# Patient Record
Sex: Male | Born: 1965 | Race: Black or African American | Hispanic: No | Marital: Married | Smoking: Former smoker
Health system: Southern US, Community
[De-identification: ages and names within clinical notes are randomized; demographics above are authoritative.]

## PROBLEM LIST (undated history)

## (undated) DIAGNOSIS — I10 Essential (primary) hypertension: Secondary | ICD-10-CM

## (undated) DIAGNOSIS — A6000 Herpesviral infection of urogenital system, unspecified: Secondary | ICD-10-CM

## (undated) DIAGNOSIS — R7989 Other specified abnormal findings of blood chemistry: Secondary | ICD-10-CM

## (undated) DIAGNOSIS — E785 Hyperlipidemia, unspecified: Secondary | ICD-10-CM

## (undated) DIAGNOSIS — E291 Testicular hypofunction: Secondary | ICD-10-CM

## (undated) DIAGNOSIS — N529 Male erectile dysfunction, unspecified: Secondary | ICD-10-CM

## (undated) DIAGNOSIS — H409 Unspecified glaucoma: Secondary | ICD-10-CM

## (undated) HISTORY — DX: Herpesviral infection of urogenital system, unspecified: A60.00

## (undated) HISTORY — DX: Unspecified glaucoma: H40.9

## (undated) HISTORY — DX: Testicular hypofunction: E29.1

## (undated) HISTORY — DX: Other specified abnormal findings of blood chemistry: R79.89

## (undated) HISTORY — DX: Male erectile dysfunction, unspecified: N52.9

## (undated) HISTORY — PX: THROAT SURGERY: SHX803

## (undated) HISTORY — PX: WISDOM TOOTH EXTRACTION: SHX21

## (undated) HISTORY — DX: Hyperlipidemia, unspecified: E78.5

## (undated) HISTORY — DX: Essential (primary) hypertension: I10

---

## 2004-04-13 ENCOUNTER — Inpatient Hospital Stay (HOSPITAL_COMMUNITY): Admission: EM | Admit: 2004-04-13 | Discharge: 2004-04-15 | Payer: Self-pay | Admitting: Emergency Medicine

## 2004-06-16 ENCOUNTER — Emergency Department (HOSPITAL_COMMUNITY): Admission: EM | Admit: 2004-06-16 | Discharge: 2004-06-16 | Payer: Self-pay | Admitting: Emergency Medicine

## 2005-10-27 ENCOUNTER — Ambulatory Visit (HOSPITAL_BASED_OUTPATIENT_CLINIC_OR_DEPARTMENT_OTHER): Admission: RE | Admit: 2005-10-27 | Discharge: 2005-10-27 | Payer: Self-pay | Admitting: Otolaryngology

## 2010-11-02 ENCOUNTER — Encounter: Payer: Self-pay | Admitting: Internal Medicine

## 2011-03-18 ENCOUNTER — Emergency Department (HOSPITAL_COMMUNITY): Payer: BC Managed Care – PPO

## 2011-03-18 ENCOUNTER — Emergency Department (HOSPITAL_COMMUNITY)
Admission: EM | Admit: 2011-03-18 | Discharge: 2011-03-18 | Disposition: A | Discharge status: Home / Self Care | Payer: BC Managed Care – PPO | Attending: Emergency Medicine | Admitting: Emergency Medicine

## 2011-03-18 DIAGNOSIS — I10 Essential (primary) hypertension: Secondary | ICD-10-CM | POA: Insufficient documentation

## 2011-03-18 DIAGNOSIS — J029 Acute pharyngitis, unspecified: Secondary | ICD-10-CM | POA: Insufficient documentation

## 2011-03-18 LAB — CBC
HCT: 54.4 % — ABNORMAL HIGH (ref 39.0–52.0)
MCHC: 32.9 g/dL (ref 30.0–36.0)
MCV: 91.1 fL (ref 78.0–100.0)
Platelets: 197 10*3/uL (ref 150–400)
RBC: 5.97 MIL/uL — ABNORMAL HIGH (ref 4.22–5.81)
RDW: 14.3 % (ref 11.5–15.5)
WBC: 17.3 10*3/uL — ABNORMAL HIGH (ref 4.0–10.5)

## 2011-03-18 LAB — POCT I-STAT, CHEM 8
BUN: 14 mg/dL (ref 6–23)
Calcium, Ion: 1.12 mmol/L (ref 1.12–1.32)
Chloride: 106 mEq/L (ref 96–112)
Creatinine, Ser: 1.2 mg/dL (ref 0.50–1.35)
Glucose, Bld: 97 mg/dL (ref 70–99)
HCT: 62 % — ABNORMAL HIGH (ref 39.0–52.0)
Hemoglobin: 21.1 g/dL (ref 13.0–17.0)
Potassium: 3.9 mEq/L (ref 3.5–5.1)
Sodium: 141 mEq/L (ref 135–145)
TCO2: 24 mmol/L (ref 0–100)

## 2011-03-18 LAB — DIFFERENTIAL
Basophils Absolute: 0 10*3/uL (ref 0.0–0.1)
Eosinophils Absolute: 0.1 10*3/uL (ref 0.0–0.7)
Eosinophils Relative: 1 % (ref 0–5)
Lymphocytes Relative: 11 % — ABNORMAL LOW (ref 12–46)
Lymphs Abs: 1.8 10*3/uL (ref 0.7–4.0)
Monocytes Absolute: 1.4 10*3/uL — ABNORMAL HIGH (ref 0.1–1.0)
Monocytes Relative: 8 % (ref 3–12)
Neutro Abs: 14 10*3/uL — ABNORMAL HIGH (ref 1.7–7.7)
Neutrophils Relative %: 81 % — ABNORMAL HIGH (ref 43–77)

## 2011-03-18 LAB — RAPID STREP SCREEN (MED CTR MEBANE ONLY): Streptococcus, Group A Screen (Direct): NEGATIVE

## 2011-03-18 LAB — POCT I-STAT TROPONIN I: Troponin i, poc: 0 ng/mL (ref 0.00–0.08)

## 2012-02-25 ENCOUNTER — Telehealth (INDEPENDENT_AMBULATORY_CARE_PROVIDER_SITE_OTHER): Payer: Self-pay

## 2012-02-25 NOTE — Telephone Encounter (Signed)
[facility] pt has appt with Dr. Corliss Skains at 4:15 on 02/26/12.

## 2012-02-26 ENCOUNTER — Ambulatory Visit (INDEPENDENT_AMBULATORY_CARE_PROVIDER_SITE_OTHER): Payer: BC Managed Care – PPO | Admitting: Surgery

## 2012-02-26 ENCOUNTER — Encounter (INDEPENDENT_AMBULATORY_CARE_PROVIDER_SITE_OTHER): Payer: Self-pay | Admitting: Surgery

## 2012-02-26 VITALS — BP 122/64 | HR 64 | Temp 98.0°F | Resp 16 | Ht 74.0 in | Wt 176.2 lb

## 2012-02-26 DIAGNOSIS — K645 Perianal venous thrombosis: Secondary | ICD-10-CM

## 2012-02-26 NOTE — Progress Notes (Signed)
Patient ID: Shawn Pena, male   DOB: [birth year], 46 y.o.   MRN: [id number]  Chief Complaint  Patient presents with  . Pain    thrombosed hems    HPI Shawn Pena is a 46 y.o. male.  Referred by Dr. Margaretmary Pena HPI This is a 46 year old male in good health who presents with a thrombosed external hemorrhoid. This has been present for several days. The patient does admit to some constipation. He denies any bleeding. The pain has become quite severe. He presents now for evaluation. Dr. Chestine Pena did try to treat him with p.r.n. ProctoFoam p.r.n. pain medication.Past Medical History  Diagnosis Date  . Hypertension     History reviewed. No pertinent past surgical history.  Family History  Problem Relation Age of Onset  . Kidney disease Mother   . Heart disease Mother   . Hypertension Mother   . Diabetes Mother   . Hypertension Father   . Hypertension Sister   . Hypertension Brother   . Cancer Maternal Grandmother     stomach  . Cancer Maternal Grandfather     prostate  . Cancer Paternal Grandmother     ovarian  . Stroke Paternal Grandfather     Social History History  Substance Use Topics  . Smoking status: Former Smoker    Quit date: 07/14/1997  . Smokeless tobacco: Never Used  . Alcohol Use: Yes     "bottle of wine / 6 beers / 4 shots of liquor per week"    No Known Allergies  Current Outpatient Prescriptions  Medication Sig Dispense Refill  . bimatoprost (LUMIGAN) 0.03 % ophthalmic drops 1 drop at bedtime.        . hydrochlorothiazide (HYDRODIURIL) 25 MG tablet Take 25 mg by mouth daily.      Marland Kitchen testosterone cypionate (DEPOTESTOTERONE CYPIONATE) 100 MG/ML injection Inject 200 mg into the muscle every 28 (twenty-eight) days. For IM use only      . Testosterone (ANDROGEL PUMP) 1.25 GM/ACT (1%) GEL Place onto the skin.          Review of Systems Review of Systems  Constitutional: Negative for fever, chills and unexpected weight change.  HENT: Negative for hearing  loss, congestion, sore throat, trouble swallowing and voice change.   Eyes: Negative for visual disturbance.  Respiratory: Negative for cough and wheezing.   Cardiovascular: Negative for chest pain, palpitations and leg swelling.  Gastrointestinal: Positive for constipation, anal bleeding and rectal pain. Negative for nausea, vomiting, abdominal pain, diarrhea, blood in stool and abdominal distention.  Genitourinary: Negative for hematuria and difficulty urinating.  Musculoskeletal: Negative for arthralgias.  Skin: Negative for rash and wound.  Neurological: Negative for seizures, syncope, weakness and headaches.  Hematological: Negative for adenopathy. Does not bruise/bleed easily.  Psychiatric/Behavioral: Negative for confusion.    Blood pressure 122/64, pulse 64, temperature 98 F (36.7 C), temperature source Temporal, resp. rate 16, height 6\' 2"  (1.88 m), weight 176 lb 3.2 oz (79.924 kg).  Physical Exam Physical Exam WDWN in NAD Rectal - very large thrombosed external hemorrhoid left lateral; no sign of abscess or fistula; no obvious fissure  Data Reviewed none  Assessment    Large thrombosed external hemorrhoid    Plan    Evacuation of thrombosed external hemorrhoid - Betadine, 1% lidocaine, 2 cm incision, we evacuated a large amount of clot with decompression of the thrombosed hemorrhoid. Dry dressing was applied.  The patient should wear gauze in his underwear until the wound is healed. Frequent  sitz baths. Stool softeners as needed to avoid constipation. Followup if the wound has not healed to 3 weeks.       Shawn Pena K. 02/26/2012, 5:37 PM

## 2012-02-26 NOTE — Patient Instructions (Addendum)
Frequent sitz baths Stool softeners as needed to avoid constipation

## 2012-03-01 ENCOUNTER — Telehealth (INDEPENDENT_AMBULATORY_CARE_PROVIDER_SITE_OTHER): Payer: Self-pay

## 2012-03-01 NOTE — Telephone Encounter (Signed)
The patient would like to know when he can return to work.  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  I asked Dr Corliss Skains and he states he should be doing better by now and if so then he can go back tomorrow.  I notified the pt and he does not need a note.

## 2012-07-10 ENCOUNTER — Ambulatory Visit: Payer: Self-pay | Admitting: Family Medicine

## 2012-07-10 ENCOUNTER — Ambulatory Visit: Payer: Self-pay

## 2012-07-10 VITALS — BP 98/62 | HR 91 | Temp 99.7°F | Resp 16 | Ht 75.0 in | Wt 168.0 lb

## 2012-07-10 DIAGNOSIS — R05 Cough: Secondary | ICD-10-CM

## 2012-07-10 DIAGNOSIS — J189 Pneumonia, unspecified organism: Secondary | ICD-10-CM

## 2012-07-10 DIAGNOSIS — R059 Cough, unspecified: Secondary | ICD-10-CM

## 2012-07-10 LAB — POCT CBC
Granulocyte percent: 72 %G (ref 37–80)
HCT, POC: 46.3 % (ref 43.5–53.7)
Hemoglobin: 13.7 g/dL — AB (ref 14.1–18.1)
Lymph, poc: 2.6 (ref 0.6–3.4)
MCH, POC: 27.8 pg (ref 27–31.2)
MCHC: 29.6 g/dL — AB (ref 31.8–35.4)
MCV: 94.1 fL (ref 80–97)
MID (cbc): 1.2 — AB (ref 0–0.9)
MPV: 8.7 fL (ref 0–99.8)
POC Granulocyte: 9.6 — AB (ref 2–6.9)
POC LYMPH PERCENT: 19.3 %L (ref 10–50)
POC MID %: 8.7 %M (ref 0–12)
Platelet Count, POC: 143 10*3/uL (ref 142–424)
RBC: 4.92 M/uL (ref 4.69–6.13)
RDW, POC: 13.9 %
WBC: 13.3 10*3/uL — AB (ref 4.6–10.2)

## 2012-07-10 LAB — POCT INFLUENZA A/B
Influenza A, POC: NEGATIVE
Influenza B, POC: NEGATIVE

## 2012-07-10 MED ORDER — GUAIFENESIN-CODEINE 100-10 MG/5ML PO SYRP: 10.0000 mL | ORAL_SOLUTION | Freq: Four times a day (QID) | ORAL | Status: DC | PRN

## 2012-07-10 MED ORDER — LEVOFLOXACIN 750 MG PO TABS: 750.0000 mg | ORAL_TABLET | Freq: Every day | ORAL | Status: AC

## 2012-07-10 NOTE — Progress Notes (Signed)
Subjective:    Patient ID: Shawn Pena, male    DOB: 11-Feb-1966, 46 y.o.   MRN: [id number]  HPI  Shawn Pena is a 46 yo man who has been ill for a week with URI type symptoms. Severe fatigue, and increasing congestion and then today noted bright red blood in his mucous-like sputum.  No ShoB or CP, no nasal congestion but whole body hurts, chills, sweats but has not checked temp.  Past Medical History  Diagnosis Date  . Hypertension   . Glaucoma   . Hypogonadism male    Current Outpatient Prescriptions on File Prior to Visit  Medication Sig Dispense Refill  . bimatoprost (LUMIGAN) 0.03 % ophthalmic drops 1 drop at bedtime.        . hydrochlorothiazide (HYDRODIURIL) 25 MG tablet Take 25 mg by mouth daily.      . Testosterone (ANDROGEL PUMP) 1.25 GM/ACT (1%) GEL Place onto the skin.         No Known Allergies   Review of Systems  Constitutional: Positive for chills, activity change, appetite change and fatigue. Negative for fever, diaphoresis and unexpected weight change.  HENT: Positive for neck pain. Negative for ear pain, congestion, sore throat, rhinorrhea, sneezing, trouble swallowing, neck stiffness and sinus pressure.   Respiratory: Positive for cough. Negative for chest tightness and shortness of breath.   Cardiovascular: Negative for chest pain.  Gastrointestinal: Positive for nausea. Negative for vomiting, abdominal pain, diarrhea and constipation.  Genitourinary: Negative for dysuria.  Musculoskeletal: Positive for myalgias and arthralgias. Negative for joint swelling and gait problem.  Skin: Negative for rash.  Neurological: Positive for headaches. Negative for syncope.  Hematological: Negative for adenopathy. Does not bruise/bleed easily.  Psychiatric/Behavioral: Positive for sleep disturbance.      BP 98/62  Pulse 91  Temp 99.7 F (37.6 C)  Resp 16  Ht 6\' 3"  (1.905 m)  Wt 168 lb (76.204 kg)  BMI 21.00 kg/m2  SpO2 94% Objective:   Physical Exam    Constitutional: He is oriented to person, place, and time. He appears well-developed and well-nourished. He appears lethargic.  Non-toxic appearance. He appears ill. No distress.  HENT:  Head: Normocephalic and atraumatic.  Right Ear: External ear and ear canal normal.  Left Ear: Tympanic membrane, external ear and ear canal normal.  Nose: Nose normal.  Mouth/Throat: Oropharynx is clear and moist and mucous membranes are normal. No oropharyngeal exudate.       Right canal occulded w/ cerumen  Eyes: Conjunctivae normal are normal. No scleral icterus.  Neck: Normal range of motion. Neck supple. No thyromegaly present.  Cardiovascular: Normal rate, regular rhythm, normal heart sounds and intact distal pulses.   Pulmonary/Chest: Effort normal. No respiratory distress. He has decreased breath sounds. He has rhonchi.       Bronchial breath sounds diffuse  Musculoskeletal: He exhibits no edema.  Lymphadenopathy:    He has no cervical adenopathy.  Neurological: He is oriented to person, place, and time. He appears lethargic.  Skin: Skin is warm and dry. He is not diaphoretic. No erythema.  Psychiatric: He has a normal mood and affect. His behavior is normal.     [facility] reading (PRIMARY) by  Dr. Clelia Croft.  Right lower lobe infiltrate - on radiology over-read the suspected infiltrate was actually an extravacation of the hemidiaphragm though due to bibasilar atelectasis, an infiltrate could not be excluded.  Results for orders placed in visit on 07/10/12  POCT INFLUENZA A/B      Component  Value Range   Influenza A, POC Negative     Influenza B, POC Negative    POCT CBC      Component Value Range   WBC 13.3 (*) 4.6 - 10.2 K/uL   Lymph, poc 2.6  0.6 - 3.4   POC LYMPH PERCENT 19.3  10 - 50 %L   MID (cbc) 1.2 (*) 0 - 0.9   POC MID % 8.7  0 - 12 %M   POC Granulocyte 9.6 (*) 2 - 6.9   Granulocyte percent 72.0  37 - 80 %G   RBC 4.92  4.69 - 6.13 M/uL   Hemoglobin 13.7 (*) 14.1 - 18.1 g/dL   HCT, POC  16.1  09.6 - 53.7 %   MCV 94.1  80 - 97 fL   MCH, POC 27.8  27 - 31.2 pg   MCHC 29.6 (*) 31.8 - 35.4 g/dL   RDW, POC 04.5     Platelet Count, POC 143  142 - 424 K/uL   MPV 8.7  0 - 99.8 fL    Assessment & Plan:   1. Cough  POCT Influenza A/B, POCT CBC, DG Chest 2 View  2. Pneumonia  levofloxacin (LEVAQUIN) 750 MG tablet, guaiFENesin-codeine (ROBITUSSIN AC) 100-10 MG/5ML syrup  RTC if no improvement within 1-2d or if sxs worse at all.

## 2012-07-26 ENCOUNTER — Other Ambulatory Visit: Payer: Self-pay | Admitting: *Deleted

## 2012-07-26 DIAGNOSIS — J189 Pneumonia, unspecified organism: Secondary | ICD-10-CM

## 2014-01-30 ENCOUNTER — Ambulatory Visit (INDEPENDENT_AMBULATORY_CARE_PROVIDER_SITE_OTHER): Payer: BC Managed Care – PPO | Admitting: Family Medicine

## 2014-01-30 VITALS — BP 122/78 | HR 76 | Temp 98.3°F | Resp 16 | Ht 74.0 in | Wt 172.6 lb

## 2014-01-30 DIAGNOSIS — H6121 Impacted cerumen, right ear: Secondary | ICD-10-CM

## 2014-01-30 DIAGNOSIS — H9190 Unspecified hearing loss, unspecified ear: Secondary | ICD-10-CM

## 2014-01-30 DIAGNOSIS — H9191 Unspecified hearing loss, right ear: Secondary | ICD-10-CM

## 2014-01-30 DIAGNOSIS — H6120 Impacted cerumen, unspecified ear: Secondary | ICD-10-CM

## 2014-01-30 NOTE — Patient Instructions (Signed)
Cerumen Plug  A cerumen plug is having too much wax in your ear canal. The outer ear canal is lined with hairs and glands that secrete wax. This wax is called cerumen. This protects the ear canal. It also helps prevent material from entering the ear. Too much wax can cause a feeling of fullness in the ears, decreased hearing, ringing in the ears, or an earache. Sometimes your caregiver will remove a cerumen plug with an instrument called a curette. Or he/she may flush the ear canal with warm water from a syringe to remove the wax. You may simply be sent home to follow the home care instructions below for wax removal.  Generally ear wax does not have to be removed unless it is causing a problem such as one of those listed above. When too much wax is causing a problem, the following are a few home remedies which can be used to help this problem.  HOME CARE INSTRUCTIONS   · Put a couple drops of glycerin, baby oil, or mineral oil in the ear a couple times of day. Do this every day for several days. After putting the drops in, you will need to lay with the affected ear pointing up for a couple minutes. This allows the drops to remain in the canal and run down to the area of wax blockage. This will soften the wax plug. It may also make your hearing worse as the wax softens and blocks the canal even more.  · After a couple days, you may gently flush the ear canal with warm water from a syringe. Do this by pulling your ear up and back with your head tilted slightly forward and towards a pan to catch the water. This is most easily done with a helper. You can also accomplish the same thing by letting the shower beat into your ear canal to wash the wax out. Sometimes this will not be immediately successful. You will have to return to the first step of using the oil to further soften the wax. Then resume washing the ear canal out with a syringe or shower.  · Following removal of the wax, put ten to twenty drops of rubbing  alcohol into the outer ears. This will dry the canal and prevent an infection.  · Do not irrigate or wash out your ears if you have had a perforated ear drum or mastoid surgery.  SEEK IMMEDIATE MEDICAL CARE IF:   · You are unsuccessful with the above instructions for home care.  · You develop ear pain or drainage from the ear.  MAKE SURE YOU:   · Understand these instructions.  · Will watch your condition.  · Will get help right away if you are not doing well or get worse.  Document Released: 03/25/2001 Document Revised: 09/22/2011 Document Reviewed: 06/21/2008  ExitCare® Patient Information ©2015 [employer]. This information is not intended to replace advice given to you by your health care provider. Make sure you discuss any questions you have with your health care provider.

## 2014-01-30 NOTE — Progress Notes (Signed)
   Subjective:    Patient ID: Shawn Pena, male    DOB: 05/24/1966, 48 y.o.   MRN: [id number]  HPI Patient presents today with c/o stopped up ears for about a week. Head feels full and he has ringing in the right ear. Now, feels muffled, right greater than left. He was diagnosed with  pharyngitis 3 weeks ago, this resolved with antibiotic/mucinex. He was recently told that his HCTZ dose may be "overly drying," and has been cutting his tablet in half per his doctor's recommendation.   Past Medical History  Diagnosis Date  . Hypertension   . Glaucoma   . Hypogonadism male   History reviewed.  No pertinent past surgical history.  Review of Systems No runny nose, no sore throat, no cough, no fever, no SOB/CP.    Objective:   Physical Exam  Vitals reviewed. Constitutional: He is oriented to person, place, and time. He appears well-developed and well-nourished.  HENT:  Head: Normocephalic and atraumatic.  Right Ear: There is tenderness. Decreased hearing is noted.  Left Ear: Hearing, tympanic membrane and external ear normal.  Nose: Nose normal.  Mouth/Throat: Oropharynx is clear and moist.  Right inferior, distal ear canal with healing abrasion (patient reports he scratched it), canal occluded with cerumen.  Eyes: Right eye exhibits no discharge. Left eye exhibits no discharge. Right conjunctiva is not injected. Left conjunctiva is not injected.  Neck: Normal range of motion. Neck supple.  Cardiovascular: Normal rate.   Musculoskeletal: Normal range of motion.  Lymphadenopathy:    He has no cervical adenopathy.  Neurological: He is alert and oriented to person, place, and time.  Skin: Skin is warm and dry.  Psychiatric: He has a normal mood and affect. His behavior is normal. Judgment and thought content normal.          Assessment & Plan:

## 2015-09-28 ENCOUNTER — Ambulatory Visit (INDEPENDENT_AMBULATORY_CARE_PROVIDER_SITE_OTHER): Payer: PRIVATE HEALTH INSURANCE | Admitting: Internal Medicine

## 2015-09-28 VITALS — BP 132/80 | HR 104 | Temp 98.6°F | Resp 17 | Ht 75.5 in | Wt 181.0 lb

## 2015-09-28 DIAGNOSIS — B349 Viral infection, unspecified: Secondary | ICD-10-CM | POA: Diagnosis not present

## 2015-09-28 NOTE — Patient Instructions (Signed)
     IF you received an x-ray today, you will receive an invoice from [facility]. Please contact [facility] at [phone] with questions or concerns regarding your invoice.   IF you received labwork today, you will receive an invoice from [facility]. Please contact [facility] at [phone] with questions or concerns regarding your invoice.   Our billing staff will not be able to assist you with questions regarding bills from these companies.  You will be contacted with the lab results as soon as they are available. The fastest way to get your results is to activate your My Chart account. Instructions are located on the last page of this paperwork. If you have not heard from us regarding the results in 2 weeks, please contact this office.      

## 2015-09-28 NOTE — Progress Notes (Signed)
   Subjective:  By signing my name below, I, Shawn Pena, attest that this documentation has been prepared under the direction and in the presence of Tami Lin, MD.  Electronically Signed: [name], ED Scribe. 09/28/2015. 2:43 PM.  Patient ID: Shawn Pena, male    DOB: Aug 03, 1965, 50 y.o.   MRN: [id number]  HPI Chief Complaint  Patient presents with  . Cough  . Fatigue  . Nasal Congestion  . Sore Throat    HPI Comments: Shawn Pena is a 50 y.o. male who presents to the [facility] complaining of cough and fatigue that began about 1 week ago. Pt states for the past week he has felt worn out with body aches. He developed cough, nasal congestion and mild throat irritation that began last night. He took cough medication last night. He reports some trouble sleeping due to symptoms. He denies fever, trouble swallowing, decreased appetite, urinary symptoms. .   Patient Active Problem List   Diagnosis Date Noted  . Thrombosed external hemorrhoid 02/26/2012   History reviewed. No pertinent past surgical history. Past Medical History  Diagnosis Date  . Hypertension   . Glaucoma   . Hypogonadism male    Prior to Admission medications   Medication Sig Start Date End Date Taking? Authorizing Provider  bimatoprost (LUMIGAN) 0.03 % ophthalmic drops 1 drop at bedtime.     Yes Historical Provider, MD  brimonidine-timolol (COMBIGAN) 0.2-0.5 % ophthalmic solution Place 2 drops into both eyes every 12 (twelve) hours.   Yes Historical Provider, MD  guaiFENesin (MUCINEX) 600 MG 12 hr tablet Take by mouth 2 (two) times daily.   Yes Historical Provider, MD  hydrochlorothiazide (HYDRODIURIL) 25 MG tablet Take 25 mg by mouth daily.   Yes Historical Provider, MD    Review of Systems  Constitutional: Negative for fever, chills and appetite change.  HENT: Positive for congestion and sore throat. Negative for trouble swallowing.   Respiratory: Positive for cough. Negative  for wheezing.   Gastrointestinal: Negative for nausea, vomiting and abdominal distention.  Genitourinary: Negative for dysuria, frequency and difficulty urinating.   Objective:   Physical Exam  Constitutional: He is oriented to person, place, and time. He appears well-developed and well-nourished. No distress.  HENT:  Head: Normocephalic and atraumatic.  Right Ear: External ear normal.  Left Ear: External ear normal.  Nose: Nose normal.  Mouth/Throat: Oropharynx is clear and moist. No oropharyngeal exudate.  Eyes: Conjunctivae and EOM are normal.  Neck: Neck supple.  Cardiovascular: Normal rate, regular rhythm and normal heart sounds.   No murmur heard. Pulmonary/Chest: Effort normal and breath sounds normal. He has no wheezes. He has no rales.  Musculoskeletal: Normal range of motion.  Neurological: He is alert and oriented to person, place, and time.  Skin: Skin is warm and dry.  Psychiatric: He has a normal mood and affect. His behavior is normal.  Nursing note and vitals reviewed.  Filed Vitals:   09/28/15 1348  BP: 132/80  Pulse: 104  Temp: 98.6 F (37 C)  TempSrc: Oral  Resp: 17  Height: 6' 3.5" (1.918 m)  Weight: 181 lb (82.101 kg)  SpO2: 98%    Assessment & Plan:  Viral syndrome  otc meds bedrest/fluids oow til tues

## 2017-07-20 ENCOUNTER — Telehealth: Payer: Self-pay | Admitting: Internal Medicine

## 2017-07-20 NOTE — Telephone Encounter (Signed)
Records received put in your folder pt is coming in for new pt 08/17/2017

## 2017-08-17 ENCOUNTER — Encounter: Payer: Self-pay | Admitting: Medical

## 2017-08-17 ENCOUNTER — Encounter: Payer: Self-pay | Admitting: Internal Medicine

## 2017-08-17 ENCOUNTER — Ambulatory Visit: Payer: PRIVATE HEALTH INSURANCE | Admitting: Medical

## 2017-08-17 VITALS — BP 118/84 | HR 97 | Ht 74.0 in | Wt 179.4 lb

## 2017-08-17 DIAGNOSIS — Z1211 Encounter for screening for malignant neoplasm of colon: Secondary | ICD-10-CM | POA: Diagnosis not present

## 2017-08-17 DIAGNOSIS — Z1159 Encounter for screening for other viral diseases: Secondary | ICD-10-CM

## 2017-08-17 DIAGNOSIS — E291 Testicular hypofunction: Secondary | ICD-10-CM | POA: Diagnosis not present

## 2017-08-17 DIAGNOSIS — R7989 Other specified abnormal findings of blood chemistry: Secondary | ICD-10-CM | POA: Insufficient documentation

## 2017-08-17 DIAGNOSIS — Z125 Encounter for screening for malignant neoplasm of prostate: Secondary | ICD-10-CM | POA: Insufficient documentation

## 2017-08-17 DIAGNOSIS — Z0000 Encounter for general adult medical examination without abnormal findings: Secondary | ICD-10-CM | POA: Insufficient documentation

## 2017-08-17 NOTE — Progress Notes (Signed)
Subjective: Chief Complaint  Patient presents with  . New Patient (Initial Visit)    discuss testosterone    Was seeing Dr. Jeanann Lewandowsky for last 30 years until he retired recently.  Thus he needed to get new PCP.    Has hx/o low testosterone, hypogonadism since age 52yo.   In the past has used Androgel, other gel, but been on TST injection 2 times per month. Used to get the shot in the past, but wife has been giving the shot at home for past month.  Last lab for TST last year twice.  Been doing TST injection 2 times per month for past 6+ months.    Is not completely out, has refills.    Last injection was last week on Wednesday about 5 days ago.   His vial label shows 200mg /ml, 1.5cc q2wk.    HTN - on HCTZ daily.  [facility] and [facility] tract infection, was put on prednisone, doxycycline and tessalon perles.   Doing better with those symptoms.  He notes hx/o facial swelling, worse years ago.  Had allergist consult, had allergy testing.  They were never sure what caused it but thinks it was stress related.  Was under financial pressure 2005-2013.  Once he had it paid off hasn't had those issues any more.    No other aggravating or relieving factors. No other complaint.   Past Medical History:  Diagnosis Date  . Glaucoma   . Hypertension   . Hypogonadism male    Current Outpatient Medications on File Prior to Visit  Medication Sig Dispense Refill  . benzonatate (TESSALON) 100 MG capsule Swallow whole one (100mg ) capsule by mouth 3 times a day  as needed.Do not break, chew, dissolve, cut or crush.    . benzonatate (TESSALON) 100 MG capsule TAKE 1 CAPSULE BY MOUTH THREE TIMES A DAY AS NEEDED *DO NOT BREAK, CHEW, DISSOLVE, CUT, OR CRUSH*  0  . bimatoprost (LUMIGAN) 0.03 % ophthalmic drops 1 drop at bedtime.      . brimonidine-timolol (COMBIGAN) 0.2-0.5 % ophthalmic solution Place 2 drops into both eyes every 12 (twelve) hours.    Marland Kitchen doxycycline  (VIBRA-TABS) 100 MG tablet Take by mouth.    . hydrochlorothiazide (HYDRODIURIL) 25 MG tablet Take 25 mg by mouth daily.    . predniSONE (DELTASONE) 10 MG tablet 40 mg by mouth daily for 5 days    . guaiFENesin (MUCINEX) 600 MG 12 hr tablet Take by mouth 2 (two) times daily.     No current facility-administered medications on file prior to visit.    ROS as in subjective    Objective: BP 118/84   Pulse 97   Ht 6\' 2"  (1.88 m)   Wt 179 lb 6.4 oz (81.4 kg)   SpO2 96%   BMI 23.03 kg/m   General appearance: alert, no distress, WD/WN,  Neck: supple, no lymphadenopathy, no thyromegaly, no masses Heart: RRR, normal S1, S2, no murmurs Lungs: CTA bilaterally, no wheezes, rhonchi, or rales Abdomen: +bs, soft, non tender, non distended, no masses, no hepatomegaly, no splenomegaly Pulses: 2+ symmetric, upper and lower extremities, normal cap refill Ext: no edema    Assessment: Encounter Diagnoses  Name Primary?  . Low testosterone Yes  . Hypogonadism in male   . Screening for prostate cancer   . Screen for colon cancer      Plan Low testosterone, hypogonadism - has been on current therapy for a while.  Lab today.   C/t same medication  PSA lab today.  Plan rectal exam on next visit/physical visit  Refer for 1st colonoscopy  [place] was seen today for new patient (initial visit).  Diagnoses and all orders for this visit:  Low testosterone -     Testosterone  Hypogonadism in male -     Testosterone  Screening for prostate cancer -     PSA  Screen for colon cancer -     Ambulatory referral to Gastroenterology

## 2017-08-18 LAB — PSA: Prostate Specific Ag, Serum: 1.7 ng/mL (ref 0.0–4.0)

## 2017-08-18 LAB — TESTOSTERONE: Testosterone: 588 ng/dL (ref 264–916)

## 2017-08-24 ENCOUNTER — Other Ambulatory Visit (INDEPENDENT_AMBULATORY_CARE_PROVIDER_SITE_OTHER): Payer: PRIVATE HEALTH INSURANCE

## 2017-08-24 DIAGNOSIS — E291 Testicular hypofunction: Secondary | ICD-10-CM

## 2017-08-24 MED ORDER — TESTOSTERONE CYPIONATE 200 MG/ML IM SOLN: 300.0000 mg | INTRAMUSCULAR | Status: DC

## 2017-08-24 MED ORDER — TESTOSTERONE CYPIONATE 200 MG/ML IM SOLN
300.0000 mg | INTRAMUSCULAR | Status: DC
  Administered 2017-08-24: 300 mg via INTRAMUSCULAR

## 2017-09-02 ENCOUNTER — Encounter: Payer: Self-pay | Admitting: Medical

## 2017-09-07 ENCOUNTER — Other Ambulatory Visit (INDEPENDENT_AMBULATORY_CARE_PROVIDER_SITE_OTHER): Payer: PRIVATE HEALTH INSURANCE

## 2017-09-07 DIAGNOSIS — E291 Testicular hypofunction: Secondary | ICD-10-CM | POA: Diagnosis not present

## 2017-09-07 MED ORDER — TESTOSTERONE CYPIONATE 200 MG/ML IM SOLN
300.0000 mg | INTRAMUSCULAR | Status: DC
  Administered 2017-09-07 – 2017-10-05 (×3): 300 mg via INTRAMUSCULAR

## 2017-09-21 ENCOUNTER — Other Ambulatory Visit (INDEPENDENT_AMBULATORY_CARE_PROVIDER_SITE_OTHER): Payer: PRIVATE HEALTH INSURANCE

## 2017-09-21 DIAGNOSIS — E291 Testicular hypofunction: Secondary | ICD-10-CM | POA: Diagnosis not present

## 2017-10-05 ENCOUNTER — Other Ambulatory Visit (INDEPENDENT_AMBULATORY_CARE_PROVIDER_SITE_OTHER): Payer: PRIVATE HEALTH INSURANCE

## 2017-10-05 ENCOUNTER — Other Ambulatory Visit: Payer: Self-pay

## 2017-10-05 ENCOUNTER — Ambulatory Visit (AMBULATORY_SURGERY_CENTER): Payer: PRIVATE HEALTH INSURANCE | Admitting: *Deleted

## 2017-10-05 VITALS — Ht 75.0 in | Wt 180.0 lb

## 2017-10-05 DIAGNOSIS — Z1211 Encounter for screening for malignant neoplasm of colon: Secondary | ICD-10-CM

## 2017-10-05 DIAGNOSIS — E291 Testicular hypofunction: Secondary | ICD-10-CM | POA: Diagnosis not present

## 2017-10-05 MED ORDER — NA SULFATE-K SULFATE-MG SULF 17.5-3.13-1.6 GM/177ML PO SOLN: 1.0000 | Freq: Once | ORAL | 0 refills | Status: AC

## 2017-10-05 NOTE — Progress Notes (Signed)
No egg or soy allergy known to patient  No issues with past sedation with any surgeries  or procedures, no intubation problems  No diet pills per patient No home 02 use per patient  No blood thinners per patient  Pt denies issues with constipation  No A fib or A flutter  EMMI video sent to pt's e mail  15 coupon to pt for suprep in [place] today

## 2017-10-12 HISTORY — PX: COLONOSCOPY: SHX174

## 2017-10-13 ENCOUNTER — Encounter: Payer: Self-pay | Admitting: Internal Medicine

## 2017-10-19 ENCOUNTER — Encounter: Payer: Self-pay | Admitting: Internal Medicine

## 2017-10-21 ENCOUNTER — Telehealth: Payer: Self-pay

## 2017-10-21 ENCOUNTER — Telehealth: Payer: Self-pay | Admitting: Medical

## 2017-10-21 NOTE — Telephone Encounter (Signed)
Patient wife called and stated that [facility]- and they stated he had [facility], and if he needs to have any test done that it be done at [facility] because she works there and her insurance pays for it. FYI

## 2017-10-21 NOTE — Telephone Encounter (Signed)
Please call patient and see how he is feeling.   I received notice from [facility] he saw them 10/19/17 (5:20pm) for pneumonia.     Remind him in the future to try calling here first.   We try working people in for sick visits instead of them having to go to urgent care or [facility] particularly during normal hours M-F

## 2017-10-26 ENCOUNTER — Other Ambulatory Visit (INDEPENDENT_AMBULATORY_CARE_PROVIDER_SITE_OTHER): Payer: PRIVATE HEALTH INSURANCE

## 2017-10-26 ENCOUNTER — Ambulatory Visit (AMBULATORY_SURGERY_CENTER): Payer: PRIVATE HEALTH INSURANCE | Admitting: Internal Medicine

## 2017-10-26 ENCOUNTER — Other Ambulatory Visit: Payer: Self-pay

## 2017-10-26 ENCOUNTER — Encounter: Payer: Self-pay | Admitting: Medical

## 2017-10-26 ENCOUNTER — Encounter: Payer: Self-pay | Admitting: Internal Medicine

## 2017-10-26 VITALS — BP 130/86 | HR 74 | Temp 98.4°F | Resp 22 | Ht 74.0 in | Wt 179.0 lb

## 2017-10-26 DIAGNOSIS — Z1211 Encounter for screening for malignant neoplasm of colon: Secondary | ICD-10-CM | POA: Diagnosis not present

## 2017-10-26 DIAGNOSIS — D123 Benign neoplasm of transverse colon: Secondary | ICD-10-CM

## 2017-10-26 DIAGNOSIS — E291 Testicular hypofunction: Secondary | ICD-10-CM | POA: Diagnosis not present

## 2017-10-26 MED ORDER — TESTOSTERONE CYPIONATE 200 MG/ML IM SOLN
300.0000 mg | Freq: Once | INTRAMUSCULAR | Status: AC
  Administered 2017-10-26: 300 mg via INTRAMUSCULAR

## 2017-10-26 MED ORDER — SODIUM CHLORIDE 0.9 % IV SOLN: 500.0000 mL | Freq: Once | INTRAVENOUS | Status: DC

## 2017-10-26 NOTE — Op Note (Signed)
[place] Patient Name: Shawn Pena Procedure Date: 10/26/2017 2:11 PM MRN: [id number] Endoscopist: Jerene Bears , MD Age: 52 Referring MD:  Date of Birth: [birth year] Gender: Male Account #: [id number] Procedure:                Colonoscopy Indications:              Screening for colorectal malignant neoplasm, This                            is the patient's first colonoscopy Medicines:                Monitored Anesthesia Care Procedure:                Pre-Anesthesia Assessment:                           - Prior to the procedure, a History and Physical                            was performed, and patient medications and                            allergies were reviewed. The patient's tolerance of                            previous anesthesia was also reviewed. The risks                            and benefits of the procedure and the sedation                            options and risks were discussed with the patient.                            All questions were answered, and informed consent                            was obtained. Prior Anticoagulants: The patient has                            taken no previous anticoagulant or antiplatelet                            agents. ASA Grade Assessment: II - A patient with                            mild systemic disease. After reviewing the risks                            and benefits, the patient was deemed in                            satisfactory condition to undergo the procedure.  After obtaining informed consent, the colonoscope                            was passed under direct vision. Throughout the                            procedure, the patient's blood pressure, pulse, and                            oxygen saturations were monitored continuously. The                            Colonoscope was introduced through the anus and                            advanced to the the cecum,  identified by                            appendiceal orifice and ileocecal valve. The                            colonoscopy was performed without difficulty. The                            patient tolerated the procedure well. The quality                            of the bowel preparation was good. Scope In: 2:34:24 PM Scope Out: 2:48:41 PM Scope Withdrawal Time: 0 hours 9 minutes 13 seconds  Total Procedure Duration: 0 hours 14 minutes 17 seconds  Findings:                 The digital rectal exam was normal.                           Two sessile polyps were found in the transverse                            colon. The polyps were 4 to 5 mm in size. These                            polyps were removed with a cold snare. Resection                            and retrieval were complete.                           A few small-mouthed diverticula were found in the                            hepatic flexure.                           The exam was otherwise without abnormality on  direct and retroflexion views. Complications:            No immediate complications. Estimated Blood Loss:     Estimated blood loss was minimal. Impression:               - Two 4 to 5 mm polyps in the transverse colon,                            removed with a cold snare. Resected and retrieved.                           - Diverticulosis at the hepatic flexure.                           - The examination was otherwise normal on direct                            and retroflexion views. Recommendation:           - Patient has a contact number available for                            emergencies. The signs and symptoms of potential                            delayed complications were discussed with the                            patient. Return to normal activities tomorrow.                            Written discharge instructions were provided to the                            patient.                            - Resume previous diet.                           - Continue present medications.                           - Await pathology results.                           - Repeat colonoscopy is recommended. The                            colonoscopy date will be determined after pathology                            results from today's exam become available for                            review. Jerene Bears, MD 10/26/2017 2:52:11 PM This report has been signed  electronically.

## 2017-10-26 NOTE — Progress Notes (Signed)
Report given to PACU, vss 

## 2017-10-26 NOTE — Progress Notes (Signed)
Called to room to assist during endoscopic procedure.  Patient ID and intended procedure confirmed with present staff. Received instructions for my participation in the procedure from the performing physician.  

## 2017-10-26 NOTE — Patient Instructions (Signed)
YOU HAD AN ENDOSCOPIC PROCEDURE TODAY AT THE [facility]:   Refer to the procedure report that was given to you for any specific questions about what was found during the examination.  If the procedure report does not answer your questions, please call your gastroenterologist to clarify.  If you requested that your care partner not be given the details of your procedure findings, then the procedure report has been included in a sealed envelope for you to review at your convenience later.  YOU SHOULD EXPECT: Some feelings of bloating in the abdomen. Passage of more gas than usual.  Walking can help get rid of the air that was put into your GI tract during the procedure and reduce the bloating. If you had a lower endoscopy (such as a colonoscopy or flexible sigmoidoscopy) you may notice spotting of blood in your stool or on the toilet paper. If you underwent a bowel prep for your procedure, you may not have a normal bowel movement for a few days.  Please Note:  You might notice some irritation and congestion in your nose or some drainage.  This is from the oxygen used during your procedure.  There is no need for concern and it should clear up in a day or so.  SYMPTOMS TO REPORT IMMEDIATELY:   Following lower endoscopy (colonoscopy or flexible sigmoidoscopy):  Excessive amounts of blood in the stool  Significant tenderness or worsening of abdominal pains  Swelling of the abdomen that is new, acute  Fever of 100F or higher    For urgent or emergent issues, a gastroenterologist can be reached at any hour by calling [phone].   DIET:  We do recommend a small meal at first, but then you may proceed to your regular diet.  Drink plenty of fluids but you should avoid alcoholic beverages for 24 hours.  ACTIVITY:  You should plan to take it easy for the rest of today and you should NOT DRIVE or use heavy machinery until tomorrow (because of the sedation medicines used during the test).     FOLLOW UP: Our staff will call the number listed on your records the next business day following your procedure to check on you and address any questions or concerns that you may have regarding the information given to you following your procedure. If we do not reach you, we will leave a message.  However, if you are feeling well and you are not experiencing any problems, there is no need to return our call.  We will assume that you have returned to your regular daily activities without incident.  If any biopsies were taken you will be contacted by phone or by letter within the next 1-3 weeks.  Please call us at [phone] if you have not heard about the biopsies in 3 weeks.    SIGNATURES/CONFIDENTIALITY: You and/or your care partner have signed paperwork which will be entered into your electronic medical record.  These signatures attest to the fact that that the information above on your After Visit Summary has been reviewed and is understood.  Full responsibility of the confidentiality of this discharge information lies with you and/or your care-partner.    Handouts were given to your care partner on polyps and diverticulosis. You may resume your current medications today. Await biopsy results. Please call if any questions or concerns.   

## 2017-10-26 NOTE — Progress Notes (Signed)
Pt. Reports no change in his medical or surgical history since his pre-visit 10/05/2017.

## 2017-10-26 NOTE — Progress Notes (Signed)
No problems noted in the recovery room. maw 

## 2017-10-27 ENCOUNTER — Telehealth: Payer: Self-pay | Admitting: *Deleted

## 2017-10-27 NOTE — Telephone Encounter (Signed)
  Follow up Call-  Call back number 10/26/2017  Post procedure Call Back phone  # [phone]  Permission to leave phone message Yes  Some recent data might be hidden     Patient questions:  Do you have a fever, pain , or abdominal swelling? No. Pain Score  0 *  Have you tolerated food without any problems? Yes.    Have you been able to return to your normal activities? Yes.    Do you have any questions about your discharge instructions: Diet   No. Medications  No. Follow up visit  No.  Do you have questions or concerns about your Care? No.  Actions: * If pain score is 4 or above: No action needed, pain <4.

## 2017-11-02 ENCOUNTER — Encounter: Payer: Self-pay | Admitting: Internal Medicine

## 2017-11-02 ENCOUNTER — Encounter: Payer: Self-pay | Admitting: Family Medicine

## 2017-11-02 ENCOUNTER — Ambulatory Visit: Payer: PRIVATE HEALTH INSURANCE | Admitting: Family Medicine

## 2017-11-02 VITALS — BP 124/78 | HR 96 | Temp 98.1°F | Ht 75.0 in | Wt 176.0 lb

## 2017-11-02 DIAGNOSIS — J40 Bronchitis, not specified as acute or chronic: Secondary | ICD-10-CM | POA: Diagnosis not present

## 2017-11-02 MED ORDER — LEVOFLOXACIN 750 MG PO TABS: 750.0000 mg | ORAL_TABLET | Freq: Every day | ORAL | 0 refills | Status: DC

## 2017-11-02 NOTE — Progress Notes (Signed)
   Subjective:    Patient ID: Shawn Pena, male    DOB: [birth year], 52 y.o.   MRN: [id number]  HPI He is here for an interval evaluation.  He was seen on April 8 and placed on Levaquin for 5 days.  He states that he is roughly 70% better but still having difficulty with chest congestion and coughing but no fever, chills, sore throat or earache.   Review of Systems     Objective:   Physical Exam Alert and in no distress. Tympanic membranes and canals are normal. Pharyngeal area is normal. Neck is supple without adenopathy or thyromegaly. Cardiac exam shows a regular sinus rhythm without murmurs or gallops. Lungs are clear to auscultation.        Assessment & Plan:  Bronchitis - Plan: levofloxacin (LEVAQUIN) 750 MG tablet  I explained that he could potentially have had pneumonia but the therapy would be the same.  I will give him another 7 days worth of the medication and is still having difficulty, consider getting an x-ray and blood work.  He was comfortable with that.

## 2017-11-09 ENCOUNTER — Other Ambulatory Visit (INDEPENDENT_AMBULATORY_CARE_PROVIDER_SITE_OTHER): Payer: PRIVATE HEALTH INSURANCE

## 2017-11-09 DIAGNOSIS — E291 Testicular hypofunction: Secondary | ICD-10-CM | POA: Diagnosis not present

## 2017-11-09 MED ORDER — TESTOSTERONE CYPIONATE 200 MG/ML IM SOLN
300.0000 mg | Freq: Once | INTRAMUSCULAR | Status: AC
  Administered 2017-11-09: 300 mg via INTRAMUSCULAR

## 2017-11-12 ENCOUNTER — Encounter: Payer: Self-pay | Admitting: Medical

## 2017-11-16 ENCOUNTER — Encounter: Payer: Self-pay | Admitting: Medical

## 2017-11-23 ENCOUNTER — Other Ambulatory Visit (INDEPENDENT_AMBULATORY_CARE_PROVIDER_SITE_OTHER): Payer: PRIVATE HEALTH INSURANCE

## 2017-11-23 DIAGNOSIS — E291 Testicular hypofunction: Secondary | ICD-10-CM

## 2017-11-23 MED ORDER — TESTOSTERONE CYPIONATE 200 MG/ML IM SOLN
300.0000 mg | Freq: Once | INTRAMUSCULAR | Status: AC
  Administered 2017-11-23: 300 mg via INTRAMUSCULAR

## 2017-11-25 NOTE — Telephone Encounter (Signed)
done

## 2017-12-14 ENCOUNTER — Encounter: Payer: Self-pay | Admitting: Medical

## 2017-12-14 ENCOUNTER — Ambulatory Visit: Payer: PRIVATE HEALTH INSURANCE | Admitting: Medical

## 2017-12-14 VITALS — BP 120/72 | HR 70 | Resp 16 | Ht 75.5 in | Wt 176.8 lb

## 2017-12-14 DIAGNOSIS — D369 Benign neoplasm, unspecified site: Secondary | ICD-10-CM | POA: Diagnosis not present

## 2017-12-14 DIAGNOSIS — Z23 Encounter for immunization: Secondary | ICD-10-CM

## 2017-12-14 DIAGNOSIS — K635 Polyp of colon: Secondary | ICD-10-CM | POA: Diagnosis not present

## 2017-12-14 DIAGNOSIS — E291 Testicular hypofunction: Secondary | ICD-10-CM | POA: Diagnosis not present

## 2017-12-14 DIAGNOSIS — N289 Disorder of kidney and ureter, unspecified: Secondary | ICD-10-CM

## 2017-12-14 DIAGNOSIS — Z0000 Encounter for general adult medical examination without abnormal findings: Secondary | ICD-10-CM

## 2017-12-14 DIAGNOSIS — Z1159 Encounter for screening for other viral diseases: Secondary | ICD-10-CM | POA: Diagnosis not present

## 2017-12-14 DIAGNOSIS — R7989 Other specified abnormal findings of blood chemistry: Secondary | ICD-10-CM

## 2017-12-14 LAB — POCT URINALYSIS DIP (PROADVANTAGE DEVICE)
BILIRUBIN UA: NEGATIVE
BILIRUBIN UA: NEGATIVE mg/dL
Blood, UA: NEGATIVE
Glucose, UA: NEGATIVE mg/dL
Leukocytes, UA: NEGATIVE
Nitrite, UA: NEGATIVE
PH UA: 6 (ref 5.0–8.0)
PROTEIN UA: NEGATIVE mg/dL
SPECIFIC GRAVITY, URINE: 1.025
Urobilinogen, Ur: NEGATIVE

## 2017-12-14 MED ORDER — TESTOSTERONE ENANTHATE 200 MG/ML IM SOLN
300.0000 mg | Freq: Once | INTRAMUSCULAR | Status: AC
  Administered 2017-12-14: 300 mg via INTRAMUSCULAR

## 2017-12-14 NOTE — Progress Notes (Signed)
Subjective:   HPI  Shawn Pena is a 52 y.o. male who presents for Chief Complaint  Patient presents with  . Annual Exam    fasting CPE  Eye exam 1 month ago,  needs shot testosterone     Medical care team includes: Tysinger, Camelia Eng, PA-C here for primary care Dentist Eye doctor  Concerns: Had recent jock itch rash that has improved on monistat cream OTC   Reviewed their medical, surgical, family, social, medication, and allergy history and updated chart as appropriate.  Past Medical History:  Diagnosis Date  . Glaucoma   . Hypertension   . Hypogonadism male   . Low testosterone     Past Surgical History:  Procedure Laterality Date  . COLONOSCOPY  10/2017   Tubular Adenoma, repeat 5 years, [name]  . THROAT SURGERY     vocal cord polyp  . WISDOM TOOTH EXTRACTION      Social History   Socioeconomic History  . Marital status: Married    Spouse name: Not on file  . Number of children: Not on file  . Years of education: Not on file  . Highest education level: Not on file  Occupational History  . Not on file  Social Needs  . Financial resource strain: Not on file  . Food insecurity:    Worry: Not on file    Inability: Not on file  . Transportation needs:    Medical: Not on file    Non-medical: Not on file  Tobacco Use  . Smoking status: Former Smoker    Last attempt to quit: 07/14/1997    Years since quitting: 20.4  . Smokeless tobacco: Never Used  Substance and Sexual Activity  . Alcohol use: Yes    Comment: "very littl"  . Drug use: No  . Sexual activity: Never  Lifestyle  . Physical activity:    Days per week: Not on file    Minutes per session: Not on file  . Stress: Not on file  Relationships  . Social connections:    Talks on phone: Not on file    Gets together: Not on file    Attends religious service: Not on file    Active member of club or organization: Not on file    Attends meetings of clubs or organizations: Not on file   Relationship status: Not on file  . Intimate partner violence:    Fear of current or ex partner: Not on file    Emotionally abused: Not on file    Physically abused: Not on file    Forced sexual activity: Not on file  Other Topics Concern  . Not on file  Social History Narrative   Lives with wife, great nephew sometimes stays with them.  Exercise - walks some.   Stephanie Coup.  12/2017    Family History  Problem Relation Age of Onset  . Kidney disease Mother   . Heart disease Mother   . Hypertension Mother   . Diabetes Mother   . Hypertension Father   . Hypertension Sister   . Diabetes Sister   . Hypertension Brother   . Cancer Maternal Grandmother        stomach  . Stomach cancer Maternal Grandmother   . Cancer Maternal Grandfather        prostate  . Prostate cancer Maternal Grandfather   . Cancer Paternal Grandmother        ovarian  . Ovarian cancer Paternal Grandmother   .  Stroke Paternal Grandfather   . Esophageal cancer Maternal Uncle   . Colon cancer Neg Hx   . Colon polyps Neg Hx   . Rectal cancer Neg Hx      Current Outpatient Medications:  .  bimatoprost (LUMIGAN) 0.03 % ophthalmic drops, 1 drop at bedtime.  , Disp: , Rfl:  .  brimonidine-timolol (COMBIGAN) 0.2-0.5 % ophthalmic solution, Place 2 drops into both eyes every 12 (twelve) hours., Disp: , Rfl:  .  irbesartan-hydrochlorothiazide (AVALIDE) 150-12.5 MG tablet, , Disp: , Rfl:  .  testosterone enanthate (DELATESTRYL) 200 MG/ML injection, , Disp: , Rfl: 5 .  TRAVATAN Z 0.004 % SOLN ophthalmic solution, , Disp: , Rfl:  .  guaiFENesin (MUCINEX) 600 MG 12 hr tablet, Take by mouth 2 (two) times daily., Disp: , Rfl:   Current Facility-Administered Medications:  .  0.9 %  sodium chloride infusion, 500 mL, Intravenous, Once, Pyrtle, Lajuan Lines, MD .  testosterone cypionate (DEPOTESTOSTERONE CYPIONATE) injection 300 mg, 300 mg, Intramuscular, Q14 Days, Tysinger, Camelia Eng, PA-C, 300 mg at 08/24/17 1627 .  testosterone  cypionate (DEPOTESTOSTERONE CYPIONATE) injection 300 mg, 300 mg, Intramuscular, Q14 Days, Tysinger, Camelia Eng, PA-C, 300 mg at 10/05/17 1228  Allergies  Allergen Reactions  . Penicillins Rash    Review of Systems Constitutional: -fever, -chills, -sweats, -unexpected weight change, -decreased appetite, -fatigue Allergy: -sneezing, -itching, -congestion Dermatology: -changing moles, +rash, -lumps ENT: -runny nose, -ear pain, -sore throat, -hoarseness, -sinus pain, -teeth pain, - ringing in ears, -hearing loss, -nosebleeds Cardiology: -chest pain, -palpitations, -swelling, -difficulty breathing when lying flat, -waking up short of breath Respiratory: -cough, -shortness of breath, -difficulty breathing with exercise or exertion, -wheezing, -coughing up blood Gastroenterology: -abdominal pain, -nausea, -vomiting, -diarrhea, -constipation, -blood in stool, -changes in bowel movement, -difficulty swallowing or eating Hematology: -bleeding, -bruising  Musculoskeletal: -joint aches, -muscle aches, -joint swelling, -back pain, -neck pain, -cramping, -changes in gait Ophthalmology: denies vision changes, eye redness, itching, discharge Urology: -burning with urination, -difficulty urinating, -blood in urine, -urinary frequency, -urgency, -incontinence Neurology: -headache, -weakness, -tingling, -numbness, -memory loss, -falls, -dizziness Psychology: -depressed mood, -agitation, -sleep problems Male GU: no testicular mass, pain, no lymph nodes swollen, no swelling, no rash.     Objective:  BP 120/72   Pulse 70   Resp 16   Ht 6' 3.5" (1.918 m)   Wt 176 lb 12.8 oz (80.2 kg)   SpO2 96%   BMI 21.81 kg/m   General appearance: alert, no distress, WD/WN, African American male Skin: mild discoloration adjacent to scrotum on left, improving/healing HEENT: normocephalic, conjunctiva/corneas normal, sclerae anicteric, PERRLA, EOMi, nares patent, no discharge or erythema, pharynx normal Oral cavity: MMM,  tongue normal, teeth in good repair Neck: supple, no lymphadenopathy, no thyromegaly, no masses, normal ROM, no bruits Chest: non tender, normal shape and expansion Heart: RRR, normal S1, S2, no murmurs Lungs: CTA bilaterally, no wheezes, rhonchi, or rales Abdomen: +bs, soft, non tender, non distended, no masses, no hepatomegaly, no splenomegaly, no bruits Back: non tender, normal ROM, no scoliosis Musculoskeletal: upper extremities non tender, no obvious deformity, normal ROM throughout, lower extremities non tender, no obvious deformity, normal ROM throughout Extremities: no edema, no cyanosis, no clubbing Pulses: 2+ symmetric, upper and lower extremities, normal cap refill Neurological: alert, oriented x 3, CN2-12 intact, strength normal upper extremities and lower extremities, sensation normal throughout, DTRs 2+ throughout, no cerebellar signs, gait normal Psychiatric: normal affect, behavior normal, pleasant  GU: normal male external genitalia,circumcised, nontender, no masses, no hernia,  no lymphadenopathy Rectal: prostate mildly enlarged, no nodules, +external hemorrhoid presents   Adult ECG Report  Indication: HTN, physical  Rate: 65 bpm  Rhythm: normal sinus rhythm  QRS Axis: -18 degrees  PR Interval: 128 ms  QRS Duration: 58ms  QTc: 446ms  Conduction Disturbances: none  Other Abnormalities: possible left atrial enlargement  Patient's cardiac risk factors are: hypertension.  EKG comparison: none  Narrative Interpretation: borderline EKG    Assessment and Plan :   Encounter Diagnoses  Name Primary?  . Encounter for health maintenance examination in adult Yes  . Hypogonadism in male   . Low testosterone   . Renal insufficiency   . Encounter for hepatitis C screening test for low risk patient   . Tubular adenoma   . Polyp of colon, unspecified part of colon, unspecified type   . Need for Tdap vaccination     Physical exam - discussed and counseled on healthy  lifestyle, diet, exercise, preventative care, vaccinations, sick and well care, proper use of emergency dept and after hours care, and addressed their concerns.    Health screening: See your eye doctor yearly for routine vision care. See your dentist yearly for routine dental care including hygiene visits twice yearly.  Cancer screening Colonoscopy:  Reviewed colonoscopy on file that is up to date  Discussed PSA, prostate exam, and prostate cancer screening risks/benefits.     Vaccinations: Advised yearly influenza vaccine  Counseled on the Tdap (tetanus, diptheria, and acellular pertussis) vaccine.  Vaccine information sheet given. Tdap vaccine given after consent obtained.   Counseled on Shingles vaccine, he will call and check insurance coverage  Separate significant chronic issues discussed: Renal insufficiency on 2018 labs that were sent in from another office.  Discussed likelihood of CKD, in relation to hypertension.    HTN - c/t same medication.  counseled on need for exercise, healthy diet.   Low T - injection today, lab today.  Savon was seen today for annual exam.  Diagnoses and all orders for this visit:  Encounter for health maintenance examination in adult -     Testosterone -     CBC -     Lipid panel -     Microalbumin / creatinine urine ratio -     Renal Function Panel -     Hepatic function panel -     HIV antibody -     RPR -     EKG 12-Lead -     Hepatitis C antibody  Hypogonadism in male  Low testosterone  Renal insufficiency -     Microalbumin / creatinine urine ratio -     Renal Function Panel  Encounter for hepatitis C screening test for low risk patient -     Hepatitis C antibody  Tubular adenoma  Polyp of colon, unspecified part of colon, unspecified type  Need for Tdap vaccination    Follow-up pending labs, yearly for physical

## 2017-12-14 NOTE — Patient Instructions (Signed)
Thanks for trusting [place] with your health care and for coming in for a physical today.  Below are some general recommendations I have for you:  Yearly screenings See your eye doctor yearly for routine vision care. See your dentist yearly for routine dental care including hygiene visits twice yearly. See me here yearly for a routine physical and preventative care visit    Please follow up yearly for a physical.   Preventative Care for Adults - Male      [place]:  A routine yearly physical is a good way to check in with your primary care provider about your health and preventive screening. It is also an opportunity to share updates about your health and any concerns you have, and receive a thorough all-over exam.   Most health insurance companies pay for at least some preventative services.  Check with your health plan for specific coverages.  WHAT PREVENTATIVE SERVICES DO WOMEN NEED?  Adult men should have their weight and blood pressure checked regularly.   Men age 38 and older should have their cholesterol levels checked regularly.  Beginning at age 61 and continuing to age 3, men should be screened for colorectal cancer.  Certain people may need continued testing until age 69.  Updating vaccinations is part of preventative care.  Vaccinations help protect against diseases such as the flu.  Osteoporosis is a disease in which the bones lose minerals and strength as we age. Men ages 76 and over should discuss this with their caregivers  Lab tests are generally done as part of preventative care to screen for anemia and blood disorders, to screen for problems with the kidneys and liver, to screen for bladder problems, to check blood sugar, and to check your cholesterol level.  Preventative services generally include counseling about diet, exercise, avoiding tobacco, drugs, excessive alcohol consumption, and sexually transmitted infections.    GENERAL  RECOMMENDATIONS FOR GOOD HEALTH:  Healthy diet:  Eat a variety of foods, including fruit, vegetables, animal or vegetable protein, such as meat, fish, chicken, and eggs, or beans, lentils, tofu, and grains, such as rice.  Drink plenty of water daily.  Decrease saturated fat in the diet, avoid lots of red meat, processed foods, sweets, fast foods, and fried foods.  Exercise:  Aerobic exercise helps maintain good heart health. At least 30-40 minutes of moderate-intensity exercise is recommended. For example, a brisk walk that increases your heart rate and breathing. This should be done on most days of the week.   Find a type of exercise or a variety of exercises that you enjoy so that it becomes a part of your daily life.  Examples are running, walking, swimming, water aerobics, and biking.  For motivation and support, explore group exercise such as aerobic class, spin class, Zumba, Yoga,or  martial arts, etc.    Set exercise goals for yourself, such as a certain weight goal, walk or run in a race such as a 5k walk/run.  Speak to your primary care provider about exercise goals.  Disease prevention:  If you smoke or chew tobacco, find out from your caregiver how to quit. It can literally save your life, no matter how long you have been a tobacco user. If you do not use tobacco, never begin.   Maintain a healthy diet and normal weight. Increased weight leads to problems with blood pressure and diabetes.   The Body Mass Index or BMI is a way of measuring how much of your body  is fat. Having a BMI above 27 increases the risk of heart disease, diabetes, hypertension, stroke and other problems related to obesity. Your caregiver can help determine your BMI and based on it develop an exercise and dietary program to help you achieve or maintain this important measurement at a healthful level.  High blood pressure causes heart and blood vessel problems.  Persistent high blood pressure should be treated  with medicine if weight loss and exercise do not work.   Fat and cholesterol leaves deposits in your arteries that can block them. This causes heart disease and vessel disease elsewhere in your body.  If your cholesterol is found to be high, or if you have heart disease or certain other medical conditions, then you may need to have your cholesterol monitored frequently and be treated with medication.   Ask if you should have a cardiac stress test if your history suggests this. A stress test is a test done on a treadmill that looks for heart disease. This test can find disease prior to there being a problem.  Osteoporosis is a disease in which the bones lose minerals and strength as we age. This can result in serious bone fractures. Risk of osteoporosis can be identified using a bone density scan. Men ages 39 and over should discuss this with their caregivers. Ask your caregiver whether you should be taking a calcium supplement and Vitamin D, to reduce the rate of osteoporosis.   Avoid drinking alcohol in excess (more than two drinks per day).  Avoid use of street drugs. Do not share needles with anyone. Ask for professional help if you need assistance or instructions on stopping the use of alcohol, cigarettes, and/or drugs.  Brush your teeth twice a day with fluoride toothpaste, and floss once a day. Good oral hygiene prevents tooth decay and gum disease. The problems can be painful, unattractive, and can cause other health problems. Visit your dentist for a routine oral and dental check up and preventive care every 6-12 months.   Look at your skin regularly.  Use a mirror to look at your back. Notify your caregivers of changes in moles, especially if there are changes in shapes, colors, a size larger than a pencil eraser, an irregular border, or development of new moles.  Safety:  Use seatbelts 100% of the time, whether driving or as a passenger.  Use safety devices such as hearing protection if you  work in environments with loud noise or significant background noise.  Use safety glasses when doing any work that could send debris in to the eyes.  Use a helmet if you ride a bike or motorcycle.  Use appropriate safety gear for contact sports.  Talk to your caregiver about gun safety.  Use sunscreen with a SPF (or skin protection factor) of 15 or greater.  Lighter skinned people are at a greater risk of skin cancer. Don't forget to also wear sunglasses in order to protect your eyes from too much damaging sunlight. Damaging sunlight can accelerate cataract formation.   Practice safe sex. Use condoms. Condoms are used for birth control and to help reduce the spread of sexually transmitted infections (or STIs).  Some of the STIs are gonorrhea (the clap), chlamydia, syphilis, trichomonas, herpes, HPV (human papilloma virus) and HIV (human immunodeficiency virus) which causes AIDS. The herpes, HIV and HPV are viral illnesses that have no cure. These can result in disability, cancer and death.   Keep carbon monoxide and smoke detectors in your home  functioning at all times. Change the batteries every 6 months or use a model that plugs into the wall.   Vaccinations:  Stay up to date with your tetanus shots and other required immunizations. You should have a booster for tetanus every 10 years. Be sure to get your flu shot every year, since 5%-20% of the [place] population comes down with the flu. The flu vaccine changes each year, so being vaccinated once is not enough. Get your shot in the fall, before the flu season peaks.   Other vaccines to consider:  Human Papilloma Virus or HPV causes cancer of the cervix, and other infections that can be transmitted from person to person. There is a vaccine for HPV, and males should get immunized between the ages of 37 and 29. It requires a series of 3 shots.   Pneumococcal vaccine to protect against certain types of pneumonia.  This is normally recommended for adults  age 61 or older.  However, adults younger than 54 years old with certain underlying conditions such as diabetes, heart or lung disease should also receive the vaccine.  Shingles vaccine to protect against Varicella Zoster if you are older than age 59, or younger than 54 years old with certain underlying illness.  If you have not had the Shingrix vaccine, please call your insurer to inquire about coverage for the Shingrix vaccine given in 2 doses.   Some insurers cover this vaccine after age 71, some cover this after age 28.  If your insurer covers this, then call to schedule appointment to have this vaccine here  Hepatitis A vaccine to protect against a form of infection of the liver by a virus acquired from food.  Hepatitis B vaccine to protect against a form of infection of the liver by a virus acquired from blood or body fluids, particularly if you work in health care.  If you plan to travel internationally, check with your local health department for specific vaccination recommendations.   What should I know about cancer screening? Many types of cancers can be detected early and may often be prevented. Lung Cancer  You should be screened every year for lung cancer if: ? You are a current smoker who has smoked for at least 30 years. ? You are a former smoker who has quit within the past 15 years.  Talk to your health care provider about your screening options, when you should start screening, and how often you should be screened.  Colorectal Cancer  Routine colorectal cancer screening usually begins at 54 years of age and should be repeated every 5-10 years until you are 54 years old. You may need to be screened more often if early forms of precancerous polyps or small growths are found. Your health care provider may recommend screening at an earlier age if you have risk factors for colon cancer.  Your health care provider may recommend using home test kits to check for hidden blood in  the stool.  A small camera at the end of a tube can be used to examine your colon (sigmoidoscopy or colonoscopy). This checks for the earliest forms of colorectal cancer.  Prostate and Testicular Cancer  Depending on your age and overall health, your health care provider may do certain tests to screen for prostate and testicular cancer.  Talk to your health care provider about any symptoms or concerns you have about testicular or prostate cancer.  Skin Cancer  Check your skin from head to toe regularly.  Tell  your health care provider about any new moles or changes in moles, especially if: ? There is a change in a mole's size, shape, or color. ? You have a mole that is larger than a pencil eraser.  Always use sunscreen. Apply sunscreen liberally and repeat throughout the day.  Protect yourself by wearing long sleeves, pants, a wide-brimmed hat, and sunglasses when outside.

## 2017-12-14 NOTE — Addendum Note (Signed)
Addended by: Gwinda Maine on: 12/14/2017 12:30 PM   Modules accepted: Orders

## 2017-12-15 ENCOUNTER — Other Ambulatory Visit: Payer: Self-pay | Admitting: Medical

## 2017-12-15 LAB — HEPATITIS C ANTIBODY: Hep C Virus Ab: <0.1 s/co ratio (ref 0.0–0.9)

## 2017-12-15 LAB — RENAL FUNCTION PANEL
ALBUMIN: 4.9 g/dL (ref 3.5–5.5)
BUN/Creatinine Ratio: 11 (ref 9–20)
BUN: 20 mg/dL (ref 6–24)
CO2: 24 mmol/L (ref 20–29)
CREATININE: 1.76 mg/dL — AB (ref 0.76–1.27)
Calcium: 10.1 mg/dL (ref 8.7–10.2)
Chloride: 101 mmol/L (ref 96–106)
GFR calc Af Amer: 50 mL/min/{1.73_m2} — ABNORMAL LOW (ref >59)
GFR, EST NON AFRICAN AMERICAN: 43 mL/min/{1.73_m2} — AB (ref >59)
Glucose: 79 mg/dL (ref 65–99)
Phosphorus: 3.5 mg/dL (ref 2.5–4.5)
Potassium: 4.3 mmol/L (ref 3.5–5.2)
Sodium: 142 mmol/L (ref 134–144)

## 2017-12-15 LAB — HEPATIC FUNCTION PANEL
ALK PHOS: 51 IU/L (ref 39–117)
ALT: 31 IU/L (ref 0–44)
AST: 25 IU/L (ref 0–40)
BILIRUBIN, DIRECT: 0.23 mg/dL (ref 0.00–0.40)
Bilirubin Total: 0.8 mg/dL (ref 0.0–1.2)
TOTAL PROTEIN: 8 g/dL (ref 6.0–8.5)

## 2017-12-15 LAB — CBC
HEMOGLOBIN: 17.1 g/dL (ref 13.0–17.7)
Hematocrit: 51.4 % — ABNORMAL HIGH (ref 37.5–51.0)
MCH: 31.1 pg (ref 26.6–33.0)
MCHC: 33.3 g/dL (ref 31.5–35.7)
MCV: 94 fL (ref 79–97)
PLATELETS: 193 10*3/uL (ref 150–450)
RBC: 5.49 x10E6/uL (ref 4.14–5.80)
RDW: 13.8 % (ref 12.3–15.4)
WBC: 7.8 10*3/uL (ref 3.4–10.8)

## 2017-12-15 LAB — MICROALBUMIN / CREATININE URINE RATIO
Creatinine, Urine: 147 mg/dL
Microalbumin, Urine: <3 ug/mL

## 2017-12-15 LAB — LIPID PANEL
CHOL/HDL RATIO: 4.5 ratio (ref 0.0–5.0)
CHOLESTEROL TOTAL: 187 mg/dL (ref 100–199)
HDL: 42 mg/dL (ref >39)
LDL Calculated: 129 mg/dL — ABNORMAL HIGH (ref 0–99)
Triglycerides: 79 mg/dL (ref 0–149)
VLDL Cholesterol Cal: 16 mg/dL (ref 5–40)

## 2017-12-15 LAB — TESTOSTERONE: TESTOSTERONE: 367 ng/dL (ref 264–916)

## 2017-12-15 LAB — RPR: RPR Ser Ql: NONREACTIVE

## 2017-12-15 LAB — HIV ANTIBODY (ROUTINE TESTING W REFLEX): HIV Screen 4th Generation wRfx: NONREACTIVE

## 2017-12-15 MED ORDER — PRAVASTATIN SODIUM 20 MG PO TABS: 20.0000 mg | ORAL_TABLET | Freq: Every evening | ORAL | 0 refills | Status: DC

## 2017-12-15 MED ORDER — TESTOSTERONE CYPIONATE 200 MG/ML IM SOLN: 200.0000 mg | INTRAMUSCULAR | 3 refills | Status: DC

## 2017-12-15 MED ORDER — IRBESARTAN-HYDROCHLOROTHIAZIDE 150-12.5 MG PO TABS: 1.0000 | ORAL_TABLET | Freq: Every day | ORAL | 3 refills | Status: DC

## 2017-12-28 ENCOUNTER — Other Ambulatory Visit: Payer: PRIVATE HEALTH INSURANCE

## 2018-01-04 ENCOUNTER — Other Ambulatory Visit (INDEPENDENT_AMBULATORY_CARE_PROVIDER_SITE_OTHER): Payer: PRIVATE HEALTH INSURANCE

## 2018-01-04 ENCOUNTER — Telehealth: Payer: Self-pay

## 2018-01-04 DIAGNOSIS — E291 Testicular hypofunction: Secondary | ICD-10-CM

## 2018-01-04 MED ORDER — TESTOSTERONE CYPIONATE 200 MG/ML IM SOLN
200.0000 mg | Freq: Once | INTRAMUSCULAR | Status: AC
  Administered 2018-01-04: 200 mg via INTRAMUSCULAR

## 2018-01-04 NOTE — Telephone Encounter (Signed)
Patient came in to get testosterone injection.  He will be given 75ml of testosterone every 3 weeks.  Recheck labs in 6 weeks.

## 2018-01-04 NOTE — Progress Notes (Signed)
Pt will have 1 ml every  three weeks and come back in six weeks for blood work Per [facility]

## 2018-01-18 ENCOUNTER — Ambulatory Visit: Payer: PRIVATE HEALTH INSURANCE | Admitting: Medical

## 2018-01-18 VITALS — BP 120/72 | HR 74 | Temp 98.0°F | Resp 16 | Ht 75.0 in | Wt 173.8 lb

## 2018-01-18 DIAGNOSIS — L0291 Cutaneous abscess, unspecified: Secondary | ICD-10-CM | POA: Diagnosis not present

## 2018-01-18 MED ORDER — SULFAMETHOXAZOLE-TRIMETHOPRIM 800-160 MG PO TABS
1.0000 | ORAL_TABLET | Freq: Two times a day (BID) | ORAL | 0 refills | Status: DC
Start: 2018-01-18 — End: 2018-01-25

## 2018-01-18 MED ORDER — MUPIROCIN 2 % EX OINT: 1.0000 "application " | TOPICAL_OINTMENT | Freq: Three times a day (TID) | CUTANEOUS | 0 refills | Status: DC

## 2018-01-18 NOTE — Progress Notes (Signed)
Subjective:   Shawn Pena is a 52 y.o. male who presents for evaluation of a probable cutaneous abscess.  He has several different boils, one on his left medial leg just above the knee, a few on the right upper inner thigh, but one large area on the right hip region.  Onset was in the last for 5 days gradually worsening.  He notes may be having a ball in childhood but none since.  This is all new to him.  No sick contacts with similar.  No fever, no nausea, vomiting, no recent skin contacts with similar.  Patient denies hx/o MRSA.  Patient denies hx/o I&D for similar.  Patient does not have diabetes.  Patient denies hx/o poor wound healing, compromised immunity or HIV.  No other aggravating or relieving factors.  No other c/o.  Past Medical History:  Diagnosis Date  . Glaucoma   . Hypertension   . Hypogonadism male   . Low testosterone    Current Outpatient Medications on File Prior to Visit  Medication Sig Dispense Refill  . bimatoprost (LUMIGAN) 0.03 % ophthalmic drops 1 drop at bedtime.      . brimonidine-timolol (COMBIGAN) 0.2-0.5 % ophthalmic solution Place 2 drops into both eyes every 12 (twelve) hours.    . irbesartan-hydrochlorothiazide (AVALIDE) 150-12.5 MG tablet Take 1 tablet by mouth daily. 90 tablet 3  . pravastatin (PRAVACHOL) 20 MG tablet Take 1 tablet (20 mg total) by mouth every evening. 90 tablet 0  . testosterone cypionate (DEPOTESTOSTERONE CYPIONATE) 200 MG/ML injection Inject 1 mL (200 mg total) into the muscle every 21 ( twenty-one) days. 10 mL 3  . TRAVATAN Z 0.004 % SOLN ophthalmic solution     . guaiFENesin (MUCINEX) 600 MG 12 hr tablet Take by mouth 2 (two) times daily.     Current Facility-Administered Medications on File Prior to Visit  Medication Dose Route Frequency Provider Last Rate Last Dose  . 0.9 %  sodium chloride infusion  500 mL Intravenous Once Pyrtle, Lajuan Lines, MD        Reviewed prior allergies, medications, past medical history, past surgical  history.  ROS as in subjective   Objective:   BP 120/72   Pulse 74   Temp 98 F (36.7 C) (Oral)   Resp 16   Ht 6\' 3"  (1.905 m)   Wt 173 lb 12.8 oz (78.8 kg)   SpO2 96%   BMI 21.72 kg/m   Gen: wd, wn, nad Skin: Right anterior hip region/superior inguinal region, with 4 cm indurated tender swollen erythematous lesion with slight fluctuance suggestive of abscess, smaller similar to centimeter raised lesion of left medial leg just above the knee without fluctuance or induration, and a few smaller similar minor lesions in the right upper inner thigh   Assessment:   Encounter Diagnosis  Name Primary?  . Cutaneous abscess, unspecified site Yes      Plan:   Discussed examination findings, diagnosis, usual course of illness, and options for therapy discussed. After discussing recommendations, patient agrees to I&D of right anterior hip region, oral antibiotics.    Procedure Informed consent obtained.  The area was prepped in the usual manner and the skin overlying the abscess was anesthetized with 5cc of 1% lidocaine with epinephrine.  The area was sharply incised and approx 3ccs of purulent material was obtained.  Area was irrigated with high pressure saline. Packing was not inserted. Wound was covered with sterile bandage.    Advised patient to complete the course  of oral antibiotics, use warm compresses or heat applied to the area to promote drainage.  Follow up: 3 days, pending culture.  However, if worse signs of infections as discussed (fever, chills, nausea, vomiting, worsening redness, worsening pain), then call or return immediately.

## 2018-01-18 NOTE — Patient Instructions (Signed)
You were treated for an abscess today with incision and drainage.  Recommendations  The wound will drain periodically so keep it covered with a thick layer gauze to help absorb the drainage  You can use warm compresses over the area 20 minutes twice daily to help encourage drainage  Begin Bactrim antibiotic twice daily for 10 days  Change the dressing at least twice a day or more if it is draining a lot  If any worsening signs of worse redness, worse pain, or swelling, fever, nausea then call or return immediately  The wounds and boil should gradually heal over the next 2 to 3 weeks but the initial wound inflammation should calm down within a week or so  You should probably not be in direct skin contact with people at work for at least 7 to 10 days so plan to do other type of duty that does not require direct skin to skin contact   Abscess An abscess (boil or furuncle) is an infected area that contains a collection of pus.  SYMPTOMS Signs and symptoms of an abscess include pain, tenderness, redness, or hardness. You may feel a moveable soft area under your skin. An abscess can occur anywhere in the body.  TREATMENT  A surgical cut (incision) may be made over your abscess to drain the pus. Gauze may be packed into the space or a drain may be looped through the abscess cavity (pocket). This provides a drain that will allow the cavity to heal from the inside outwards. The abscess may be painful for a few days, but should feel much better if it was drained.  Your abscess, if seen early, may not have localized and may not have been drained. If not, another appointment may be required if it does not get better on its own or with medications. HOME CARE INSTRUCTIONS   Only take over-the-counter or prescription medicines for pain, discomfort, or fever as directed by your caregiver.   Take your antibiotics as directed if they were prescribed. Finish them even if you start to feel better.   Keep  the skin and clothes clean around your abscess.   If the abscess was drained, you will need to use gauze dressing to collect any draining pus. Dressings will typically need to be changed 3 or more times a day.   The infection may spread by skin contact with others. Avoid skin contact as much as possible.   Practice good hygiene. This includes regular hand washing, cover any draining skin lesions, and do not share personal care items.   If you participate in sports, do not share athletic equipment, towels, whirlpools, or personal care items. Shower after every practice or tournament.   If a draining area cannot be adequately covered:   Do not participate in sports.   Children should not participate in day care until the wound has healed or drainage stops.   If your caregiver has given you a follow-up appointment, it is very important to keep that appointment. Not keeping the appointment could result in a much worse infection, chronic or permanent injury, pain, and disability. If there is any problem keeping the appointment, you must call back to this facility for assistance.  SEEK MEDICAL CARE IF:   You develop increased pain, swelling, redness, drainage, or bleeding in the wound site.   You develop signs of generalized infection including muscle aches, chills, fever, or a general ill feeling.   You have an oral temperature above 102 F (38.9  C).  MAKE SURE YOU:   Understand these instructions.   Will watch your condition.   Will get help right away if you are not doing well or get worse.  Document Released: 04/09/2005 Document Revised: 03/12/2011 Document Reviewed: 02/01/2008 [facility] Patient Information 2012 [place].

## 2018-01-22 LAB — WOUND CULTURE

## 2018-01-25 ENCOUNTER — Other Ambulatory Visit: Payer: Self-pay

## 2018-01-25 ENCOUNTER — Other Ambulatory Visit (INDEPENDENT_AMBULATORY_CARE_PROVIDER_SITE_OTHER): Payer: PRIVATE HEALTH INSURANCE

## 2018-01-25 DIAGNOSIS — R7989 Other specified abnormal findings of blood chemistry: Secondary | ICD-10-CM | POA: Diagnosis not present

## 2018-01-25 MED ORDER — SULFAMETHOXAZOLE-TRIMETHOPRIM 800-160 MG PO TABS: 1.0000 | ORAL_TABLET | Freq: Two times a day (BID) | ORAL | 0 refills | Status: DC

## 2018-01-25 MED ORDER — TESTOSTERONE ENANTHATE 200 MG/ML IM SOLN
300.0000 mg | INTRAMUSCULAR | Status: DC
  Administered 2018-01-25: 300 mg via INTRAMUSCULAR

## 2018-01-25 NOTE — Telephone Encounter (Signed)
Pt called stating he did not want his antibiotic to go to [facility], he wanted it to go to [facility]. Medication has been sent to [facility] per patient request.

## 2018-01-25 NOTE — Progress Notes (Signed)
He was here for testosterone injection.  I examined his wounds from last week's abscess visit.  He is improving about 50% at this point.  I sent a few more days of antibiotic to the pharmacy.  We discussed warm compresses, hot bath soap, wound care.

## 2018-02-01 ENCOUNTER — Other Ambulatory Visit: Payer: PRIVATE HEALTH INSURANCE

## 2018-02-01 ENCOUNTER — Other Ambulatory Visit: Payer: Self-pay | Admitting: Medical

## 2018-02-01 DIAGNOSIS — R7989 Other specified abnormal findings of blood chemistry: Secondary | ICD-10-CM

## 2018-02-02 LAB — TESTOSTERONE: TESTOSTERONE: 1499 ng/dL — AB (ref 264–916)

## 2018-02-15 ENCOUNTER — Other Ambulatory Visit (INDEPENDENT_AMBULATORY_CARE_PROVIDER_SITE_OTHER): Payer: PRIVATE HEALTH INSURANCE

## 2018-02-15 DIAGNOSIS — R7989 Other specified abnormal findings of blood chemistry: Secondary | ICD-10-CM | POA: Diagnosis not present

## 2018-02-15 MED ORDER — TESTOSTERONE CYPIONATE 200 MG/ML IM SOLN
200.0000 mg | INTRAMUSCULAR | Status: DC
Start: 2018-02-15 — End: 2018-11-22
  Administered 2018-02-15: 200 mg via INTRAMUSCULAR

## 2018-03-08 ENCOUNTER — Other Ambulatory Visit (INDEPENDENT_AMBULATORY_CARE_PROVIDER_SITE_OTHER): Payer: PRIVATE HEALTH INSURANCE

## 2018-03-08 DIAGNOSIS — E119 Type 2 diabetes mellitus without complications: Secondary | ICD-10-CM | POA: Diagnosis not present

## 2018-03-08 MED ORDER — TESTOSTERONE CYPIONATE 200 MG/ML IM SOLN
350.0000 mg | Freq: Once | INTRAMUSCULAR | Status: AC
  Administered 2018-03-08: 350 mg via INTRAMUSCULAR

## 2018-03-29 ENCOUNTER — Other Ambulatory Visit (INDEPENDENT_AMBULATORY_CARE_PROVIDER_SITE_OTHER): Payer: PRIVATE HEALTH INSURANCE

## 2018-03-29 ENCOUNTER — Other Ambulatory Visit: Payer: Self-pay | Admitting: Medical

## 2018-03-29 DIAGNOSIS — E291 Testicular hypofunction: Secondary | ICD-10-CM

## 2018-03-29 MED ORDER — TESTOSTERONE CYPIONATE 200 MG/ML IM SOLN
300.0000 mg | Freq: Once | INTRAMUSCULAR | Status: AC
  Administered 2018-03-29: 300 mg via INTRAMUSCULAR

## 2018-04-01 ENCOUNTER — Other Ambulatory Visit: Payer: Self-pay | Admitting: Medical

## 2018-04-01 ENCOUNTER — Telehealth: Payer: Self-pay | Admitting: Family Medicine

## 2018-04-01 NOTE — Telephone Encounter (Signed)
I cant find where I have prescribed this prior or refilled this?    Can you look at chart, and if not, we would need to discuss first.   See if he and I talked about it but I can't see where we've actually discussed this or similar medication.

## 2018-04-01 NOTE — Telephone Encounter (Signed)
[facility] req Sildenafil 20 mg #60

## 2018-04-02 NOTE — Telephone Encounter (Signed)
Patient was prescribed this by Dr Carlis Abbott and he made an appointment on Oct 30 to come in and discuss with you

## 2018-04-12 ENCOUNTER — Encounter: Payer: Self-pay | Admitting: Medical

## 2018-04-12 ENCOUNTER — Ambulatory Visit: Payer: PRIVATE HEALTH INSURANCE | Admitting: Medical

## 2018-04-12 VITALS — BP 124/70 | HR 92 | Temp 98.1°F | Resp 16 | Ht 75.0 in | Wt 174.6 lb

## 2018-04-12 DIAGNOSIS — Z2821 Immunization not carried out because of patient refusal: Secondary | ICD-10-CM | POA: Diagnosis not present

## 2018-04-12 DIAGNOSIS — R7989 Other specified abnormal findings of blood chemistry: Secondary | ICD-10-CM | POA: Diagnosis not present

## 2018-04-12 DIAGNOSIS — N529 Male erectile dysfunction, unspecified: Secondary | ICD-10-CM

## 2018-04-12 MED ORDER — SILDENAFIL CITRATE 20 MG PO TABS: ORAL_TABLET | ORAL | 2 refills | Status: DC

## 2018-04-12 NOTE — Progress Notes (Signed)
Subjective: Chief Complaint  Patient presents with  . consult    medication consult    Here for consult about sildenafil.   Prior PCP would prescribe #60 sildenafil, would typically take 2 tablets per time.  He notes 2 tablets work fine.   No prior nitrate use.  He had been on Cialis prior til it got too expensive.    Coming in to the office q3 weeks for testosterone injection.  He notes some increasing in bumps on abdomen, thinks it was related to testosterone levels being high on current injection.  No other aggravating or relieving factors. No other complaint.   Past Medical History:  Diagnosis Date  . Glaucoma   . Hypertension   . Hypogonadism male   . Low testosterone    Current Outpatient Medications on File Prior to Visit  Medication Sig Dispense Refill  . bimatoprost (LUMIGAN) 0.03 % ophthalmic drops 1 drop at bedtime.      . brimonidine-timolol (COMBIGAN) 0.2-0.5 % ophthalmic solution Place 2 drops into both eyes every 12 (twelve) hours.    . irbesartan-hydrochlorothiazide (AVALIDE) 150-12.5 MG tablet Take 1 tablet by mouth daily. 90 tablet 3  . mupirocin ointment (BACTROBAN) 2 % Place 1 application into the nose 3 (three) times daily. 22 g 0  . pravastatin (PRAVACHOL) 20 MG tablet TAKE 1 TABLET BY MOUTH ONCE DAILY IN THE EVENING 90 tablet 0  . sildenafil (REVATIO) 20 MG tablet Take 20 mg by mouth 3 (three) times daily.    Marland Kitchen testosterone cypionate (DEPOTESTOSTERONE CYPIONATE) 200 MG/ML injection Inject 1 mL (200 mg total) into the muscle every 21 ( twenty-one) days. 10 mL 3  . guaiFENesin (MUCINEX) 600 MG 12 hr tablet Take by mouth 2 (two) times daily.    Marland Kitchen sulfamethoxazole-trimethoprim (BACTRIM DS,SEPTRA DS) 800-160 MG tablet Take 1 tablet by mouth 2 (two) times daily. (Patient not taking: Reported on 04/12/2018) 10 tablet 0  . TRAVATAN Z 0.004 % SOLN ophthalmic solution      Current Facility-Administered Medications on File Prior to Visit  Medication Dose Route Frequency  Provider Last Rate Last Dose  . 0.9 %  sodium chloride infusion  500 mL Intravenous Once Pyrtle, Lajuan Lines, MD      . testosterone cypionate (DEPOTESTOSTERONE CYPIONATE) injection 200 mg  200 mg Intramuscular Q21 days Carlena Hurl, PA-C   200 mg at 02/15/18 1230  . testosterone enanthate (DELATESTRYL) injection 300 mg  300 mg Intramuscular Q28 days Carlena Hurl, PA-C   300 mg at 01/25/18 0834   ROS as in subjective   Objective: BP 124/70   Pulse 92   Temp 98.1 F (36.7 C) (Oral)   Resp 16   Ht 6\' 3"  (1.905 m)   Wt 174 lb 9.6 oz (79.2 kg)   SpO2 95%   BMI 21.82 kg/m   General appearance: alert, no distress, WD/WN,  Skin: few small follicular pink/red [id number] raised papular lesions on left and right abdomen Heart: RRR, normal S1, S2, no murmurs Lungs: CTA bilaterally, no wheezes, rhonchi, or rales Pulses: 2+ symmetric, upper and lower extremities, normal cap refill    Assessment: Encounter Diagnoses  Name Primary?  . Erectile dysfunction, unspecified erectile dysfunction type Yes  . Low testosterone   . Influenza vaccination declined      Plan: Erectile Dysfunction - Reviewed pathophysiology and differential diagnosis of erectile dysfunction with the patient.  Reviewed 12/2017 EKG and prior labs.    Discussed treatment options.  C/t sildenafil.  Discussed  potential risks of medications including hypotension and priapism.  Discussed proper use of medication.  Questions were answered.   Low T - lab today, c/t injection q3 weeks  Howie was seen today for consult.  Diagnoses and all orders for this visit:  Erectile dysfunction, unspecified erectile dysfunction type  Low testosterone -     Testosterone  Influenza vaccination declined  Other orders -     sildenafil (REVATIO) 20 MG tablet; 2 tablets (40 mg) prior to sexual activity daily prn

## 2018-04-13 LAB — TESTOSTERONE: TESTOSTERONE: 593 ng/dL (ref 264–916)

## 2018-04-19 ENCOUNTER — Other Ambulatory Visit: Payer: PRIVATE HEALTH INSURANCE

## 2018-04-26 ENCOUNTER — Other Ambulatory Visit (INDEPENDENT_AMBULATORY_CARE_PROVIDER_SITE_OTHER): Payer: PRIVATE HEALTH INSURANCE

## 2018-04-26 DIAGNOSIS — R7989 Other specified abnormal findings of blood chemistry: Secondary | ICD-10-CM | POA: Diagnosis not present

## 2018-04-26 MED ORDER — TESTOSTERONE CYPIONATE 200 MG/ML IM SOLN
200.0000 mg | Freq: Once | INTRAMUSCULAR | Status: AC
  Administered 2018-04-26: 200 mg via INTRAMUSCULAR

## 2018-05-10 ENCOUNTER — Encounter: Payer: Self-pay | Admitting: Medical

## 2018-05-10 ENCOUNTER — Ambulatory Visit (INDEPENDENT_AMBULATORY_CARE_PROVIDER_SITE_OTHER): Payer: PRIVATE HEALTH INSURANCE | Admitting: Medical

## 2018-05-10 VITALS — BP 130/80 | HR 81 | Temp 98.0°F | Resp 16 | Ht 74.0 in | Wt 172.2 lb

## 2018-05-10 DIAGNOSIS — L739 Follicular disorder, unspecified: Secondary | ICD-10-CM | POA: Insufficient documentation

## 2018-05-10 DIAGNOSIS — J329 Chronic sinusitis, unspecified: Secondary | ICD-10-CM | POA: Diagnosis not present

## 2018-05-10 MED ORDER — CHLORHEXIDINE GLUCONATE 4 % EX LIQD: Freq: Every day | CUTANEOUS | 2 refills | Status: DC | PRN

## 2018-05-10 MED ORDER — CLARITHROMYCIN 500 MG PO TABS: 500.0000 mg | ORAL_TABLET | Freq: Two times a day (BID) | ORAL | 0 refills | Status: DC

## 2018-05-10 NOTE — Progress Notes (Signed)
Subjective: Chief Complaint  Patient presents with  . cold    cough, head congestion, green snot, X 1 week   Here for cough and cold symptoms.   Been having head congestion, green snot, cough, and been having symptoms for almost a week.   using alka seltzer currently.  No fever, no NVD, no body aches or chills.   Still getting bumps here there occasionally we saw him 4 months ago including left eye, armpit.  He has 7 people living in his house.  He has a one bedroom house.  But given some family issues he had to take in some of his grandchildren  No other aggravating or relieving factors. No other complaint.    Past Medical History:  Diagnosis Date  . Glaucoma   . Hypertension   . Hypogonadism male   . Low testosterone    Current Outpatient Medications on File Prior to Visit  Medication Sig Dispense Refill  . bimatoprost (LUMIGAN) 0.03 % ophthalmic drops 1 drop at bedtime.      . brimonidine-timolol (COMBIGAN) 0.2-0.5 % ophthalmic solution Place 2 drops into both eyes every 12 (twelve) hours.    . irbesartan-hydrochlorothiazide (AVALIDE) 150-12.5 MG tablet Take 1 tablet by mouth daily. 90 tablet 3  . mupirocin ointment (BACTROBAN) 2 % Place 1 application into the nose 3 (three) times daily. 22 g 0  . pravastatin (PRAVACHOL) 20 MG tablet TAKE 1 TABLET BY MOUTH ONCE DAILY IN THE EVENING 90 tablet 0  . sildenafil (REVATIO) 20 MG tablet 2 tablets (40 mg) prior to sexual activity daily prn 60 tablet 2  . testosterone cypionate (DEPOTESTOSTERONE CYPIONATE) 200 MG/ML injection Inject 1 mL (200 mg total) into the muscle every 21 ( twenty-one) days. 10 mL 3  . TRAVATAN Z 0.004 % SOLN ophthalmic solution     . guaiFENesin (MUCINEX) 600 MG 12 hr tablet Take by mouth 2 (two) times daily.    Marland Kitchen sulfamethoxazole-trimethoprim (BACTRIM DS,SEPTRA DS) 800-160 MG tablet Take 1 tablet by mouth 2 (two) times daily. (Patient not taking: Reported on 04/12/2018) 10 tablet 0   Current Facility-Administered  Medications on File Prior to Visit  Medication Dose Route Frequency Provider Last Rate Last Dose  . 0.9 %  sodium chloride infusion  500 mL Intravenous Once Pyrtle, Lajuan Lines, MD      . testosterone cypionate (DEPOTESTOSTERONE CYPIONATE) injection 200 mg  200 mg Intramuscular Q21 days Carlena Hurl, PA-C   200 mg at 02/15/18 1230  . testosterone enanthate (DELATESTRYL) injection 300 mg  300 mg Intramuscular Q28 days Carlena Hurl, PA-C   300 mg at 01/25/18 0834   ROS as in subjective    Objective: BP 130/80   Pulse 81   Temp 98 F (36.7 C) (Oral)   Resp 16   Ht 6\' 2"  (1.88 m)   Wt 172 lb 3.2 oz (78.1 kg)   SpO2 96%   BMI 22.11 kg/m   General appearance: alert, no distress, WD/WN,  HEENT: normocephalic, sclerae anicteric, TMs flat with erythema, serous fluid behind TMs, nares with turbinate edema and mucoid discharge,+erythema, pharynx normal Oral cavity: MMM, no lesions Neck: supple, no lymphadenopathy, no thyromegaly, no masses Lungs: CTA bilaterally, no wheezes, rhonchi, or rales Skin: Scattered small erythematous maculopapular lesions along left    Assessment: Encounter Diagnoses  Name Primary?  . Sinusitis, unspecified chronicity, unspecified location Yes  . Folliculitis       Plan: Sinusitis-begin Biaxin, continue over-the-counter remedies, hydrate well and call if  worse or not improving within the next 3 to 4 days  Folliculitis-begin round of Biaxin, discussed hygiene, weekly Hibiclens body wash  [place]wick was seen today for cold.  Diagnoses and all orders for this visit:  Sinusitis, unspecified chronicity, unspecified location  Folliculitis  Other orders -     clarithromycin (BIAXIN) 500 MG tablet; Take 1 tablet (500 mg total) by mouth 2 (two) times daily. -     chlorhexidine (HIBICLENS) 4 % external liquid; Apply topically daily as needed.

## 2018-05-31 ENCOUNTER — Other Ambulatory Visit (INDEPENDENT_AMBULATORY_CARE_PROVIDER_SITE_OTHER): Payer: PRIVATE HEALTH INSURANCE

## 2018-05-31 DIAGNOSIS — E291 Testicular hypofunction: Secondary | ICD-10-CM

## 2018-05-31 MED ORDER — TESTOSTERONE CYPIONATE 200 MG/ML IM SOLN
300.0000 mg | Freq: Once | INTRAMUSCULAR | Status: AC
  Administered 2018-05-31: 300 mg via INTRAMUSCULAR

## 2018-06-28 ENCOUNTER — Encounter: Payer: Self-pay | Admitting: Medical

## 2018-06-28 ENCOUNTER — Ambulatory Visit: Payer: PRIVATE HEALTH INSURANCE | Admitting: Medical

## 2018-06-28 VITALS — BP 110/76 | HR 100 | Temp 99.3°F | Ht 74.0 in | Wt 169.4 lb

## 2018-06-28 DIAGNOSIS — R112 Nausea with vomiting, unspecified: Secondary | ICD-10-CM

## 2018-06-28 DIAGNOSIS — K529 Noninfective gastroenteritis and colitis, unspecified: Secondary | ICD-10-CM

## 2018-06-28 DIAGNOSIS — R7989 Other specified abnormal findings of blood chemistry: Secondary | ICD-10-CM | POA: Diagnosis not present

## 2018-06-28 DIAGNOSIS — R197 Diarrhea, unspecified: Secondary | ICD-10-CM

## 2018-06-28 MED ORDER — PROMETHAZINE HCL 25 MG PO TABS: 25.0000 mg | ORAL_TABLET | Freq: Three times a day (TID) | ORAL | 0 refills | Status: DC | PRN

## 2018-06-28 NOTE — Progress Notes (Signed)
Subjective: Chief Complaint  Patient presents with  . Diarrhea    vomiting started at 4am    Here for acute illness.  Awoke this morning 4am with diarrhea, went about 10 times this morning.  Then around 6am started having vomiting.  Hasn't felt like this in a long time.  Feels drained, tired, cold, fatigue, can't get warm, having chills.   Was fine yesterday.  Stools has been brown, loose.   Has slight blood on tissue once, but othewrise no blood, no black stool.   Stool currently looks brown to yellow like campbells soup.   No recent sick contacts.     Ate at a new restaurant named [employer], chicken seemed a little pink.  Otherwise no other new exposures.    No recent travel.     He drove himself today.   Also here to get his testosterone injection.  Past Medical History:  Diagnosis Date  . Glaucoma   . Hypertension   . Hypogonadism male   . Low testosterone    Current Outpatient Medications on File Prior to Visit  Medication Sig Dispense Refill  . bimatoprost (LUMIGAN) 0.03 % ophthalmic drops 1 drop at bedtime.      . brimonidine-timolol (COMBIGAN) 0.2-0.5 % ophthalmic solution Place 2 drops into both eyes every 12 (twelve) hours.    . chlorhexidine (HIBICLENS) 4 % external liquid Apply topically daily as needed. 120 mL 2  . clarithromycin (BIAXIN) 500 MG tablet Take 1 tablet (500 mg total) by mouth 2 (two) times daily. 20 tablet 0  . guaiFENesin (MUCINEX) 600 MG 12 hr tablet Take by mouth 2 (two) times daily.    . irbesartan-hydrochlorothiazide (AVALIDE) 150-12.5 MG tablet Take 1 tablet by mouth daily. 90 tablet 3  . mupirocin ointment (BACTROBAN) 2 % Place 1 application into the nose 3 (three) times daily. 22 g 0  . pravastatin (PRAVACHOL) 20 MG tablet TAKE 1 TABLET BY MOUTH ONCE DAILY IN THE EVENING 90 tablet 0  . sildenafil (REVATIO) 20 MG tablet 2 tablets (40 mg) prior to sexual activity daily prn 60 tablet 2  . sulfamethoxazole-trimethoprim (BACTRIM DS,SEPTRA DS) 800-160 MG  tablet Take 1 tablet by mouth 2 (two) times daily. 10 tablet 0  . testosterone cypionate (DEPOTESTOSTERONE CYPIONATE) 200 MG/ML injection Inject 1 mL (200 mg total) into the muscle every 21 ( twenty-one) days. 10 mL 3  . TRAVATAN Z 0.004 % SOLN ophthalmic solution      Current Facility-Administered Medications on File Prior to Visit  Medication Dose Route Frequency Provider Last Rate Last Dose  . 0.9 %  sodium chloride infusion  500 mL Intravenous Once Pyrtle, Lajuan Lines, MD      . testosterone cypionate (DEPOTESTOSTERONE CYPIONATE) injection 200 mg  200 mg Intramuscular Q21 days Carlena Hurl, PA-C   200 mg at 02/15/18 1230  . testosterone enanthate (DELATESTRYL) injection 300 mg  300 mg Intramuscular Q28 days Carlena Hurl, PA-C   300 mg at 01/25/18 0834   ROS as in subjective    Objective: BP 110/76   Pulse 100   Temp 99.3 F (37.4 C) (Oral)   Ht 6\' 2"  (1.88 m)   Wt 169 lb 6.4 oz (76.8 kg)   SpO2 98%   BMI 21.75 kg/m    General appearance: alert, no distress, WD/WN, ill appearing Oral cavity: MMM, no lesions Neck: supple, no lymphadenopathy, no thyromegaly, no masses Heart: RRR, normal S1, S2, no murmurs Lungs: CTA bilaterally, no wheezes, rhonchi, or rales  Abdomen: +mildly increased bs, soft, mild generalized tenderness, otherwise non tender, non distended, no masses, no hepatomegaly, no splenomegaly Pulses: 2+ symmetric, upper and lower extremities, normal cap refill     Assessment: Encounter Diagnoses  Name Primary?  . Acute gastroenteritis Yes  . Nausea and vomiting, intractability of vomiting not specified, unspecified vomiting type   . Diarrhea, unspecified type   . Low testosterone      Plan: We discussed his symptoms, exam findings, concerns and treatment recommendations.  Patient Instructions  Your symptoms and exam suggest either food poisoning or gastroenteritis also known as the stomach bug  Recommendations  It is important you stay hydrated  with things like water, soup, Gatorade  If you feel like eating do some soup or rice or toast or bananas  You can begin promethazine today for nausea and vomiting.  Cautioned this can make you sleepy  You can use Pepto-Bismol over-the-counter for diarrhea if needed but honestly we want the diarrhea to work itself out  We do not want to slow down the diarrhea but instead get it out of your system  Over the next few days if you have uncontrollable vomiting cannot keep anything down, fever over 101, worse blood in the stool, then call or go to the emergency department  If you cannot stay hydrated call or go to the emergency department  I expect your symptoms to improve over the next 3 to 4 days     Hamad was seen today for diarrhea.  Diagnoses and all orders for this visit:  Acute gastroenteritis  Nausea and vomiting, intractability of vomiting not specified, unspecified vomiting type  Diarrhea, unspecified type  Low testosterone  Other orders -     promethazine (PHENERGAN) 25 MG tablet; Take 1 tablet (25 mg total) by mouth every 8 (eight) hours as needed for nausea or vomiting.

## 2018-06-28 NOTE — Patient Instructions (Signed)
Your symptoms and exam suggest either food poisoning or gastroenteritis also known as the stomach bug  Recommendations  It is important you stay hydrated with things like water, soup, Gatorade  If you feel like eating do some soup or rice or toast or bananas  You can begin promethazine today for nausea and vomiting.  Cautioned this can make you sleepy  You can use Pepto-Bismol over-the-counter for diarrhea if needed but honestly we want the diarrhea to work itself out  We do not want to slow down the diarrhea but instead get it out of your system  Over the next few days if you have uncontrollable vomiting cannot keep anything down, fever over 101, worse blood in the stool, then call or go to the emergency department  If you cannot stay hydrated call or go to the emergency department  I expect your symptoms to improve over the next 3 to 4 days

## 2018-07-05 ENCOUNTER — Other Ambulatory Visit: Payer: PRIVATE HEALTH INSURANCE

## 2018-07-06 ENCOUNTER — Other Ambulatory Visit: Payer: Self-pay | Admitting: Medical

## 2018-07-19 ENCOUNTER — Other Ambulatory Visit (INDEPENDENT_AMBULATORY_CARE_PROVIDER_SITE_OTHER): Payer: PRIVATE HEALTH INSURANCE

## 2018-07-19 DIAGNOSIS — R7989 Other specified abnormal findings of blood chemistry: Secondary | ICD-10-CM | POA: Diagnosis not present

## 2018-07-19 MED ORDER — TESTOSTERONE CYPIONATE 200 MG/ML IM SOLN
200.0000 mg | Freq: Once | INTRAMUSCULAR | Status: AC
  Administered 2018-07-19: 200 mg via INTRAMUSCULAR

## 2018-08-09 ENCOUNTER — Other Ambulatory Visit (INDEPENDENT_AMBULATORY_CARE_PROVIDER_SITE_OTHER): Payer: PRIVATE HEALTH INSURANCE

## 2018-08-09 DIAGNOSIS — R7989 Other specified abnormal findings of blood chemistry: Secondary | ICD-10-CM | POA: Diagnosis not present

## 2018-08-09 MED ORDER — TESTOSTERONE CYPIONATE 200 MG/ML IM SOLN
200.0000 mg | Freq: Once | INTRAMUSCULAR | Status: AC
  Administered 2018-08-09: 200 mg via INTRAMUSCULAR

## 2018-08-30 ENCOUNTER — Other Ambulatory Visit (INDEPENDENT_AMBULATORY_CARE_PROVIDER_SITE_OTHER): Payer: PRIVATE HEALTH INSURANCE

## 2018-08-30 DIAGNOSIS — R7989 Other specified abnormal findings of blood chemistry: Secondary | ICD-10-CM | POA: Diagnosis not present

## 2018-08-30 MED ORDER — TESTOSTERONE CYPIONATE 200 MG/ML IM SOLN
200.0000 mg | Freq: Once | INTRAMUSCULAR | Status: AC
  Administered 2018-08-30: 200 mg via INTRAMUSCULAR

## 2018-09-17 ENCOUNTER — Other Ambulatory Visit: Payer: Self-pay | Admitting: Medical

## 2018-09-17 NOTE — Telephone Encounter (Signed)
[facility] is requesting to fill pt sildenafil. Please advise . Rabun

## 2018-09-20 ENCOUNTER — Other Ambulatory Visit: Payer: Self-pay | Admitting: Medical

## 2018-09-20 ENCOUNTER — Other Ambulatory Visit (INDEPENDENT_AMBULATORY_CARE_PROVIDER_SITE_OTHER): Payer: PRIVATE HEALTH INSURANCE

## 2018-09-20 DIAGNOSIS — R7989 Other specified abnormal findings of blood chemistry: Secondary | ICD-10-CM

## 2018-09-20 MED ORDER — TESTOSTERONE CYPIONATE 200 MG/ML IM SOLN
200.0000 mg | Freq: Once | INTRAMUSCULAR | Status: AC
  Administered 2018-09-20: 200 mg via INTRAMUSCULAR

## 2018-09-20 NOTE — Telephone Encounter (Signed)
Is this ok to refill?  

## 2018-10-04 ENCOUNTER — Other Ambulatory Visit: Payer: Self-pay | Admitting: Medical

## 2018-10-08 ENCOUNTER — Ambulatory Visit (INDEPENDENT_AMBULATORY_CARE_PROVIDER_SITE_OTHER): Payer: PRIVATE HEALTH INSURANCE | Admitting: Family Medicine

## 2018-10-08 ENCOUNTER — Encounter: Payer: Self-pay | Admitting: Family Medicine

## 2018-10-08 ENCOUNTER — Other Ambulatory Visit: Payer: Self-pay

## 2018-10-08 VITALS — Wt 175.0 lb

## 2018-10-08 DIAGNOSIS — R059 Cough, unspecified: Secondary | ICD-10-CM

## 2018-10-08 DIAGNOSIS — R05 Cough: Secondary | ICD-10-CM | POA: Diagnosis not present

## 2018-10-08 DIAGNOSIS — J069 Acute upper respiratory infection, unspecified: Secondary | ICD-10-CM

## 2018-10-08 NOTE — Progress Notes (Signed)
Documentation for Telephone encounter:  This telephone service is not related to other E/M service within previous 7 days.  Patient consented to the consult.  He was identified using 2 patient identifiers.  He is aware that his insurance will be billed for this visit.  This telephone consult involved patient and myself, Cleofas Hudgins Hensen, NP-C  Subjective: Complains of a 2 day history of dry cough, hoarseness, left ear ache, and low back ache. Yesterday he was sneezing and had rhinorrhea.  He felt like his symptoms were related to allergies initially.  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  Denies fever, chills, headache, sinus pressure, chest pain, palpitations, shortness of breath, wheezing, abdominal pain, nausea, vomiting, diarrhea.  Alka seltzer plus last night. Using a humidifier. Drinking plenty of fluids.   His wife is sick with cough and allergies but she is almost back to her baseline.  He also questions if he will be able to get a testosterone shot at some point.  States he is due for this.    Objective: Alert and oriented and in no acute distress.  Voice is hoarse.  Speaking in clear, full sentences without difficulty.   Assessment: Acute URI  Cough     Plan: Does not appear to be in any acute distress. Reassured him.  Symptoms ongoing for 2 days.  Denies exposure to Covid-19, he will continue with symptomatic treatment.  Encouraged him to use Mucinex, stay well-hydrated, salt water gargles, Tylenol or Advil for body aches. I also advised him to follow up next week if not improving. Also advised of any red flag symptoms such as fever higher than 100.4, worsening cough and shortness of breath and then he would need to seek immediate medical attention.   Advised that when he is back to his usual state of health that he can call and schedule a  nurse visit for his testosterone injection but he will need to follow-up with Dorothea Ogle, PA his PCP for this.   Time involving medical discussion was 10 minutes.  99441 (5-76min) 99442 (11-87min) 99443 (21-53min)

## 2018-10-11 ENCOUNTER — Other Ambulatory Visit: Payer: Self-pay

## 2018-10-11 ENCOUNTER — Other Ambulatory Visit (INDEPENDENT_AMBULATORY_CARE_PROVIDER_SITE_OTHER): Payer: PRIVATE HEALTH INSURANCE

## 2018-10-11 DIAGNOSIS — R7989 Other specified abnormal findings of blood chemistry: Secondary | ICD-10-CM | POA: Diagnosis not present

## 2018-10-11 MED ORDER — TESTOSTERONE CYPIONATE 200 MG/ML IM SOLN
200.0000 mg | Freq: Once | INTRAMUSCULAR | Status: AC
  Administered 2018-10-11: 200 mg via INTRAMUSCULAR

## 2018-10-12 ENCOUNTER — Encounter: Payer: Self-pay | Admitting: Medical

## 2018-10-12 ENCOUNTER — Ambulatory Visit (INDEPENDENT_AMBULATORY_CARE_PROVIDER_SITE_OTHER): Payer: PRIVATE HEALTH INSURANCE | Admitting: Medical

## 2018-10-12 VITALS — Ht 74.0 in | Wt 175.0 lb

## 2018-10-12 DIAGNOSIS — S39012A Strain of muscle, fascia and tendon of lower back, initial encounter: Secondary | ICD-10-CM | POA: Insufficient documentation

## 2018-10-12 DIAGNOSIS — M62830 Muscle spasm of back: Secondary | ICD-10-CM | POA: Insufficient documentation

## 2018-10-12 MED ORDER — CYCLOBENZAPRINE HCL 10 MG PO TABS: ORAL_TABLET | ORAL | 0 refills | Status: DC

## 2018-10-12 MED ORDER — HYDROCODONE-ACETAMINOPHEN 5-325 MG PO TABS: 1.0000 | ORAL_TABLET | Freq: Two times a day (BID) | ORAL | 0 refills | Status: DC | PRN

## 2018-10-12 NOTE — Progress Notes (Signed)
Subjective:     Patient ID: Shawn Pena, male   DOB: Sep 13, 1965, 53 y.o.   MRN: [id number]  Documentation for virtual audio and video telecommunications through WebEx encounter:  The patient was located at home. The provider was located in the office. The patient did consent to this visit and is aware of possible charges through their insurance for this visit.  The other persons participating in this telemedicine service were none. Time spent on call was 15 minutes and in review of previous records >20 minutes total.  This virtual service is not related to other E/M service within previous 7 days.   HPI Chief Complaint  Patient presents with  . back pain    back pain, lower back pain X on and off 1.5 week    Virtual visit today for back pain, lower back x 1.5 weeks.  can't get good sleep with the pain.  Has toted some water bottle cases the last 2 weeks on 3 separate occasions, but no other recent heavy lifting, no other injury.   Pain mostly low back, middle above buttocks.  Pain is worse  sitting down or lying flat.   Going to standing position is not bad.  Can't touch toes due to pain.   Is sitting around more since begin out of work recently.  Sitting on heating pad currently.   Tender to touch in low.  No rash, no redness, no warmth in low back.    No fever, no diarrhea, no NVD, no abdominal pain.  Using BC powder, naprosyn.  These medications help a little early on after taking this.    No numbness, tingling or weakness in legs unless he sleeps a certain way may get arm numbness, otherwise no paresthesias.    Has some back pain in the past, but never had back pain that has kept him from sleeping.    No neck pain, no chest pain, no SOB.   Has had some anxiety with begin out of work with the coronavirus pandemic stay at home measures.  Is a Art gallery manager.    No pelvic pain, no pain in genitals.  Uses testosterone therapy, gets IM injections here.     Past Medical History:   Diagnosis Date  . Glaucoma   . Hypertension   . Hypogonadism male   . Low testosterone    Current Outpatient Medications on File Prior to Visit  Medication Sig Dispense Refill  . brimonidine-timolol (COMBIGAN) 0.2-0.5 % ophthalmic solution Place 2 drops into both eyes every 12 (twelve) hours.    . chlorhexidine (HIBICLENS) 4 % external liquid Apply topically daily as needed. 120 mL 2  . irbesartan-hydrochlorothiazide (AVALIDE) 150-12.5 MG tablet Take 1 tablet by mouth daily. 90 tablet 3  . pravastatin (PRAVACHOL) 20 MG tablet TAKE 1 TABLET BY MOUTH ONCE DAILY IN THE EVENING 90 tablet 0  . ROCKLATAN 0.02-0.005 % SOLN     . sildenafil (REVATIO) 20 MG tablet TAKE 2 TABLETS BY MOUTH ONCE DAILY AS NEEDED PRIOR  TO  SEXUAL  ACTIVITY 60 tablet 0  . testosterone cypionate (DEPOTESTOSTERONE CYPIONATE) 200 MG/ML injection INJECT 1 ML INTO THE MUSCLE EVERY 21 DAYS 10 mL 0  . mupirocin ointment (BACTROBAN) 2 % Place 1 application into the nose 3 (three) times daily. (Patient not taking: Reported on 10/12/2018) 22 g 0   Current Facility-Administered Medications on File Prior to Visit  Medication Dose Route Frequency Provider Last Rate Last Dose  . testosterone cypionate (DEPOTESTOSTERONE CYPIONATE) injection 200  mg  200 mg Intramuscular Q21 days Carlena Hurl, PA-C   200 mg at 02/15/18 1230  . testosterone enanthate (DELATESTRYL) injection 300 mg  300 mg Intramuscular Q28 days Carlena Hurl, PA-C   300 mg at 01/25/18 6712   Review of Systems As in subjective     Objective:   Physical Exam Due to coronavirus pandemic stay at home measures, patient visit was virtual and they were not examined in person.   He noted pain with flexion of the back which is limited due to pain, some pain with back extension but better than flexion, no obvious deformity, able to ambulate Psych: Answers questions appropriately     Assessment:     Encounter Diagnoses  Name Primary?  . Back strain, initial  encounter Yes  . Back spasm         Plan:     We discussed the limitations of virtual visit and I cannot fully examine him.  His symptoms suggest back strain and spasm.  He was lifting cases of water this past week on 3 different days.  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  Advise he use Naprosyn twice a day for the next 4 to 5 days for pain inflammation, do not use this plus BC powders or other anti-inflammatories.  Advised he can use Flexeril 1-2 times a day for the next few days for spasm and strain.  Caution advised with sedation  For worse or breakthrough pain can use hydrocodone, cautioned about sedation side effects.  Advised gentle stretching, activity around the house as tolerated.  No heavy lifting for the next week.  Based on current symptoms he should improve over the next week.  If not improved in a week or if worse with new symptoms in the meantime then call back  He voiced understanding and agreement of plan  Shawn Pena was seen today for back pain.  Diagnoses and all orders for this visit:  Back strain, initial encounter  Back spasm  Other orders -     cyclobenzaprine (FLEXERIL) 10 MG tablet; 1 tablet once or twice daily for spasm -     HYDROcodone-acetaminophen (NORCO) 5-325 MG tablet; Take 1 tablet by mouth 2 (two) times daily as needed for moderate pain.

## 2018-11-01 ENCOUNTER — Other Ambulatory Visit (INDEPENDENT_AMBULATORY_CARE_PROVIDER_SITE_OTHER): Payer: PRIVATE HEALTH INSURANCE

## 2018-11-01 ENCOUNTER — Other Ambulatory Visit: Payer: Self-pay

## 2018-11-01 DIAGNOSIS — R7989 Other specified abnormal findings of blood chemistry: Secondary | ICD-10-CM

## 2018-11-01 MED ORDER — TESTOSTERONE CYPIONATE 200 MG/ML IM SOLN
200.0000 mg | INTRAMUSCULAR | Status: DC
  Administered 2018-11-01: 200 mg via INTRAMUSCULAR

## 2018-11-22 ENCOUNTER — Other Ambulatory Visit: Payer: Self-pay

## 2018-11-22 ENCOUNTER — Other Ambulatory Visit (INDEPENDENT_AMBULATORY_CARE_PROVIDER_SITE_OTHER): Payer: PRIVATE HEALTH INSURANCE

## 2018-11-22 DIAGNOSIS — R7989 Other specified abnormal findings of blood chemistry: Secondary | ICD-10-CM | POA: Diagnosis not present

## 2018-11-22 MED ORDER — TESTOSTERONE CYPIONATE 200 MG/ML IM SOLN
200.0000 mg | Freq: Once | INTRAMUSCULAR | Status: AC
  Administered 2018-11-22: 200 mg via INTRAMUSCULAR

## 2018-11-30 ENCOUNTER — Other Ambulatory Visit: Payer: Self-pay | Admitting: Medical

## 2018-11-30 NOTE — Telephone Encounter (Signed)
Is this ok to refill?  

## 2018-12-10 ENCOUNTER — Other Ambulatory Visit: Payer: Self-pay | Admitting: Medical

## 2018-12-10 NOTE — Telephone Encounter (Signed)
Is this ok to refill?  

## 2018-12-13 ENCOUNTER — Other Ambulatory Visit: Payer: Self-pay

## 2018-12-13 ENCOUNTER — Other Ambulatory Visit (INDEPENDENT_AMBULATORY_CARE_PROVIDER_SITE_OTHER): Payer: PRIVATE HEALTH INSURANCE

## 2018-12-13 DIAGNOSIS — R7989 Other specified abnormal findings of blood chemistry: Secondary | ICD-10-CM | POA: Diagnosis not present

## 2018-12-13 MED ORDER — TESTOSTERONE CYPIONATE 200 MG/ML IM SOLN
200.0000 mg | INTRAMUSCULAR | Status: DC
  Administered 2018-12-13: 200 mg via INTRAMUSCULAR

## 2018-12-20 ENCOUNTER — Encounter: Payer: PRIVATE HEALTH INSURANCE | Admitting: Medical

## 2019-01-03 ENCOUNTER — Other Ambulatory Visit: Payer: Self-pay

## 2019-01-03 ENCOUNTER — Encounter: Payer: Self-pay | Admitting: Medical

## 2019-01-03 ENCOUNTER — Ambulatory Visit: Payer: PRIVATE HEALTH INSURANCE | Admitting: Medical

## 2019-01-03 VITALS — BP 120/70 | HR 78 | Temp 98.8°F | Resp 16 | Ht 74.0 in | Wt 179.6 lb

## 2019-01-03 DIAGNOSIS — N529 Male erectile dysfunction, unspecified: Secondary | ICD-10-CM

## 2019-01-03 DIAGNOSIS — Z7185 Encounter for immunization safety counseling: Secondary | ICD-10-CM | POA: Insufficient documentation

## 2019-01-03 DIAGNOSIS — L739 Follicular disorder, unspecified: Secondary | ICD-10-CM | POA: Diagnosis not present

## 2019-01-03 DIAGNOSIS — D369 Benign neoplasm, unspecified site: Secondary | ICD-10-CM

## 2019-01-03 DIAGNOSIS — Z125 Encounter for screening for malignant neoplasm of prostate: Secondary | ICD-10-CM

## 2019-01-03 DIAGNOSIS — K635 Polyp of colon: Secondary | ICD-10-CM | POA: Diagnosis not present

## 2019-01-03 DIAGNOSIS — E785 Hyperlipidemia, unspecified: Secondary | ICD-10-CM | POA: Insufficient documentation

## 2019-01-03 DIAGNOSIS — E291 Testicular hypofunction: Secondary | ICD-10-CM

## 2019-01-03 DIAGNOSIS — Z7189 Other specified counseling: Secondary | ICD-10-CM

## 2019-01-03 DIAGNOSIS — Z79899 Other long term (current) drug therapy: Secondary | ICD-10-CM | POA: Insufficient documentation

## 2019-01-03 DIAGNOSIS — R7989 Other specified abnormal findings of blood chemistry: Secondary | ICD-10-CM

## 2019-01-03 DIAGNOSIS — Z0000 Encounter for general adult medical examination without abnormal findings: Secondary | ICD-10-CM | POA: Diagnosis not present

## 2019-01-03 DIAGNOSIS — N289 Disorder of kidney and ureter, unspecified: Secondary | ICD-10-CM

## 2019-01-03 MED ORDER — TESTOSTERONE CYPIONATE 200 MG/ML IM SOLN
200.0000 mg | INTRAMUSCULAR | Status: DC
  Administered 2019-01-03: 200 mg via INTRAMUSCULAR

## 2019-01-03 NOTE — Progress Notes (Signed)
Subjective:   HPI  Shawn Pena is a 53 y.o. male who presents for Chief Complaint  Patient presents with  . cpe    cpe     Patient Care Team: Kaarin Pardy, [name] as PCP - General (Family Medicine) Sees dentist Sees eye doctor  Concerns: Still getting skin lesions, pustules on right abdomen, 2 particular lesions persistent this last month.   Uses topical triple antibiotic and monistat topical.     He uses naprosyn anytime he uses generic sildenafil for ED, gives him headaches.  Has always used it this way.  He is a Product manager to ex-offenders, Product/process development scientist, his whole family has been Educational psychologist, and grandfather had first Naval architect shop in [employer] where his Art gallery manager shop is located.    Reviewed their medical, surgical, family, social, medication, and allergy history and updated chart as appropriate.  Past Medical History:  Diagnosis Date  . Glaucoma   . Hypertension   . Hypogonadism male   . Low testosterone     Past Surgical History:  Procedure Laterality Date  . COLONOSCOPY  10/2017   Tubular Adenoma, repeat 5 years, [name]  . THROAT SURGERY     vocal cord polyp  . WISDOM TOOTH EXTRACTION      Social History   Socioeconomic History  . Marital status: Married    Spouse name: Not on file  . Number of children: Not on file  . Years of education: Not on file  . Highest education level: Not on file  Occupational History  . Not on file  Social Needs  . Financial resource strain: Not on file  . Food insecurity    Worry: Not on file    Inability: Not on file  . Transportation needs    Medical: Not on file    Non-medical: Not on file  Tobacco Use  . Smoking status: Former Smoker    Quit date: 07/14/1997    Years since quitting: 21.4  . Smokeless tobacco: Never Used  Substance and Sexual Activity  . Alcohol use: Yes    Comment: "very littl"  . Drug use: No  . Sexual activity: Never  Lifestyle  . Physical activity    Days per week: Not on  file    Minutes per session: Not on file  . Stress: Not on file  Relationships  . Social Herbalist on phone: Not on file    Gets together: Not on file    Attends religious service: Not on file    Active member of club or organization: Not on file    Attends meetings of clubs or organizations: Not on file    Relationship status: Not on file  . Intimate partner violence    Fear of current or ex partner: Not on file    Emotionally abused: Not on file    Physically abused: Not on file    Forced sexual activity: Not on file  Other Topics Concern  . Not on file  Social History Narrative   Lives with wife, great nephew sometimes stays with them.  Exercise - walks some.   Stephanie Coup.  12/2017    Family History  Problem Relation Age of Onset  . Kidney disease Mother   . Heart disease Mother   . Hypertension Mother   . Diabetes Mother   . Hypertension Father   . Hypertension Sister   . Diabetes Sister   . Hypertension Brother   . Cancer  Maternal Grandmother        stomach  . Stomach cancer Maternal Grandmother   . Cancer Maternal Grandfather        prostate  . Prostate cancer Maternal Grandfather   . Cancer Paternal Grandmother        ovarian  . Ovarian cancer Paternal Grandmother   . Stroke Paternal Grandfather   . Esophageal cancer Maternal Uncle   . Colon cancer Neg Hx   . Colon polyps Neg Hx   . Rectal cancer Neg Hx      Current Outpatient Medications:  .  brimonidine-timolol (COMBIGAN) 0.2-0.5 % ophthalmic solution, Place 2 drops into both eyes every 12 (twelve) hours., Disp: , Rfl:  .  irbesartan-hydrochlorothiazide (AVALIDE) 150-12.5 MG tablet, Take 1 tablet by mouth daily., Disp: 90 tablet, Rfl: 3 .  pravastatin (PRAVACHOL) 20 MG tablet, TAKE 1 TABLET BY MOUTH ONCE DAILY IN THE EVENING, Disp: 90 tablet, Rfl: 0 .  ROCKLATAN 0.02-0.005 % SOLN, , Disp: , Rfl:  .  sildenafil (REVATIO) 20 MG tablet, TAKE 2 TABLETS BY MOUTH ONCE DAILY AS NEEDED PRIOR  TO  SEXUAL   ACTIVITY, Disp: 60 tablet, Rfl: 0 .  testosterone cypionate (DEPOTESTOSTERONE CYPIONATE) 200 MG/ML injection, INJECT 1 ML INTO THE MUSCLE EVERY 21 DAYS, Disp: 10 mL, Rfl: 0 .  chlorhexidine (HIBICLENS) 4 % external liquid, Apply topically daily as needed. (Patient not taking: Reported on 01/03/2019), Disp: 120 mL, Rfl: 2 .  cyclobenzaprine (FLEXERIL) 10 MG tablet, 1 tablet once or twice daily for spasm (Patient not taking: Reported on 01/03/2019), Disp: 15 tablet, Rfl: 0 .  HYDROcodone-acetaminophen (NORCO) 5-325 MG tablet, Take 1 tablet by mouth 2 (two) times daily as needed for moderate pain. (Patient not taking: Reported on 01/03/2019), Disp: 10 tablet, Rfl: 0 .  mupirocin ointment (BACTROBAN) 2 %, Place 1 application into the nose 3 (three) times daily. (Patient not taking: Reported on 10/12/2018), Disp: 22 g, Rfl: 0  Current Facility-Administered Medications:  .  testosterone cypionate (DEPOTESTOSTERONE CYPIONATE) injection 200 mg, 200 mg, Intramuscular, Q21 days, Avalie Oconnor, Camelia Eng, PA-C, 200 mg at 12/13/18 0830 .  testosterone cypionate (DEPOTESTOSTERONE CYPIONATE) injection 200 mg, 200 mg, Intramuscular, Q21 days, Nisha Dhami, Camelia Eng, PA-C, 200 mg at 01/03/19 1115  Allergies  Allergen Reactions  . Penicillins Rash       Review of Systems Constitutional: -fever, -chills, -sweats, -unexpected weight change, -decreased appetite, -fatigue Allergy: -sneezing, -itching, -congestion Dermatology: -changing moles, +rash, -lumps ENT: -runny nose, -ear pain, -sore throat, -hoarseness, -sinus pain, -teeth pain, - ringing in ears, -hearing loss, -nosebleeds Cardiology: -chest pain, -palpitations, -swelling, -difficulty breathing when lying flat, -waking up short of breath Respiratory: -cough, -shortness of breath, -difficulty breathing with exercise or exertion, -wheezing, -coughing up blood Gastroenterology: -abdominal pain, -nausea, -vomiting, -diarrhea, -constipation, -blood in stool, -changes in  bowel movement, -difficulty swallowing or eating Hematology: -bleeding, -bruising  Musculoskeletal: -joint aches, -muscle aches, -joint swelling, -back pain, -neck pain, -cramping, -changes in gait Ophthalmology: denies vision changes, eye redness, itching, discharge Urology: -burning with urination, -difficulty urinating, -blood in urine, -urinary frequency, -urgency, -incontinence Neurology: -headache, -weakness, -tingling, -numbness, -memory loss, -falls, -dizziness Psychology: -depressed mood, -agitation, -sleep problems Male GU: no testicular mass, pain, no lymph nodes swollen, no swelling, no rash.     Objective:  BP 120/70   Pulse 78   Temp 98.8 F (37.1 C) (Oral)   Resp 16   Ht 6\' 2"  (1.88 m)   Wt 179 lb 9.6 oz (81.5 kg)  SpO2 96%   BMI 23.06 kg/m   General appearance: alert, no distress, WD/WN, African American male Skin: right abdomen with 2 40mm papular lesions, follicular lesions, no other worrisome lesions HEENT: normocephalic, conjunctiva/corneas normal, sclerae anicteric, PERRLA, EOMi, nares patent, no discharge or erythema, pharynx normal Oral cavity: MMM, tongue normal, teeth normal Neck: supple, no lymphadenopathy, no thyromegaly, no masses, normal ROM, no bruits Chest: non tender, normal shape and expansion Heart: RRR, normal S1, S2, no murmurs Lungs: CTA bilaterally, no wheezes, rhonchi, or rales Abdomen: +bs, soft, non tender, non distended, no masses, no hepatomegaly, no splenomegaly, no bruits Back: non tender, normal ROM, no scoliosis Musculoskeletal: upper extremities non tender, no obvious deformity, normal ROM throughout, lower extremities non tender, no obvious deformity, normal ROM throughout Extremities: no edema, no cyanosis, no clubbing Pulses: 2+ symmetric, upper and lower extremities, normal cap refill Neurological: alert, oriented x 3, CN2-12 intact, strength normal upper extremities and lower extremities, sensation normal throughout, DTRs 2+  throughout, no cerebellar signs, gait normal Psychiatric: normal affect, behavior normal, pleasant  GU: normal male external genitalia,circumcised, +hypogonadism, non tender, no masses, no hernia, no lymphadenopathy Rectal: small external hemorrhoids, normal tone, prostate WNL   Assessment and Plan :   Encounter Diagnoses  Name Primary?  . Encounter for health maintenance examination in adult Yes  . Low testosterone   . Polyp of colon, unspecified part of colon, unspecified type   . Folliculitis   . Renal insufficiency   . Screening for prostate cancer   . Tubular adenoma   . Erectile dysfunction, unspecified erectile dysfunction type   . Vaccine counseling   . High risk medication use   . Hypogonadism in male   . Hyperlipidemia, unspecified hyperlipidemia type     Physical exam - discussed and counseled on healthy lifestyle, diet, exercise, preventative care, vaccinations, sick and well care, proper use of emergency dept and after hours care, and addressed their concerns.    Health screening: See your eye doctor yearly for routine vision care. See your dentist yearly for routine dental care including hygiene visits twice yearly.  Cancer screening Colonoscopy:  Reviewed colonoscopy on file that is up to date  Discussed PSA, prostate exam, and prostate cancer screening risks/benefits.     Vaccinations: Advised yearly influenza vaccine Counseled on Shingles vaccine at age 49 years and older Patient will check insurance coverage for this and consider vaccination   Separate significant chronic issues discussed: Skin lesions, folliculitis - pending labs, consider topical benzaclin or oral antibiotic daily for period of time such as monthly or every other week for a few months  HTN - c/t current therapy depending upon renal profile  Renal insufficiency - f/u pending labs  hyperlipidemia - f/u pending labs  Hypogonadism, low testosterone - c/t TST therapy, labs  today  Jullian was seen today for cpe.  Diagnoses and all orders for this visit:  Encounter for health maintenance examination in adult -     Renal Function Panel -     Hepatic function panel -     CBC -     Testosterone -     PSA -     Lipid panel  Low testosterone -     testosterone cypionate (DEPOTESTOSTERONE CYPIONATE) injection 200 mg -     Testosterone  Polyp of colon, unspecified part of colon, unspecified type  Folliculitis  Renal insufficiency -     Renal Function Panel  Screening for prostate cancer -  PSA  Tubular adenoma  Erectile dysfunction, unspecified erectile dysfunction type  Vaccine counseling  High risk medication use -     PSA  Hypogonadism in male  Hyperlipidemia, unspecified hyperlipidemia type   Follow-up pending labs, yearly for physical

## 2019-01-04 ENCOUNTER — Other Ambulatory Visit: Payer: Self-pay | Admitting: Medical

## 2019-01-04 LAB — RENAL FUNCTION PANEL
Albumin: 4.9 g/dL (ref 3.8–4.9)
BUN/Creatinine Ratio: 15 (ref 9–20)
BUN: 30 mg/dL — ABNORMAL HIGH (ref 6–24)
CO2: 22 mmol/L (ref 20–29)
Calcium: 10.1 mg/dL (ref 8.7–10.2)
Chloride: 104 mmol/L (ref 96–106)
Creatinine, Ser: 2.01 mg/dL — ABNORMAL HIGH (ref 0.76–1.27)
GFR calc Af Amer: 42 mL/min/{1.73_m2} — ABNORMAL LOW (ref >59)
GFR calc non Af Amer: 37 mL/min/{1.73_m2} — ABNORMAL LOW (ref >59)
Glucose: 146 mg/dL — ABNORMAL HIGH (ref 65–99)
Phosphorus: 3.1 mg/dL (ref 2.8–4.1)
Potassium: 4.4 mmol/L (ref 3.5–5.2)
Sodium: 144 mmol/L (ref 134–144)

## 2019-01-04 LAB — CBC
Hematocrit: 49 % (ref 37.5–51.0)
Hemoglobin: 16 g/dL (ref 13.0–17.7)
MCH: 29.6 pg (ref 26.6–33.0)
MCHC: 32.7 g/dL (ref 31.5–35.7)
MCV: 91 fL (ref 79–97)
Platelets: 189 10*3/uL (ref 150–450)
RBC: 5.4 x10E6/uL (ref 4.14–5.80)
RDW: 12.4 % (ref 11.6–15.4)
WBC: 7.8 10*3/uL (ref 3.4–10.8)

## 2019-01-04 LAB — LIPID PANEL
Chol/HDL Ratio: 3 ratio (ref 0.0–5.0)
Cholesterol, Total: 137 mg/dL (ref 100–199)
HDL: 45 mg/dL (ref >39)
LDL Calculated: 62 mg/dL (ref 0–99)
Triglycerides: 152 mg/dL — ABNORMAL HIGH (ref 0–149)
VLDL Cholesterol Cal: 30 mg/dL (ref 5–40)

## 2019-01-04 LAB — HEPATIC FUNCTION PANEL
ALT: 16 IU/L (ref 0–44)
AST: 22 IU/L (ref 0–40)
Alkaline Phosphatase: 63 IU/L (ref 39–117)
Bilirubin Total: 0.7 mg/dL (ref 0.0–1.2)
Bilirubin, Direct: 0.21 mg/dL (ref 0.00–0.40)
Total Protein: 8 g/dL (ref 6.0–8.5)

## 2019-01-04 LAB — PSA: Prostate Specific Ag, Serum: 2.2 ng/mL (ref 0.0–4.0)

## 2019-01-04 LAB — TESTOSTERONE: Testosterone: 115 ng/dL — ABNORMAL LOW (ref 264–916)

## 2019-01-04 MED ORDER — SILDENAFIL CITRATE 20 MG PO TABS: ORAL_TABLET | ORAL | 2 refills | Status: DC

## 2019-01-04 MED ORDER — IRBESARTAN-HYDROCHLOROTHIAZIDE 150-12.5 MG PO TABS: 1.0000 | ORAL_TABLET | Freq: Every day | ORAL | 3 refills | Status: DC

## 2019-01-04 MED ORDER — CLINDAMYCIN PHOS-BENZOYL PEROX 1-5 % EX GEL: CUTANEOUS | 1 refills | Status: DC

## 2019-01-04 MED ORDER — CHLORHEXIDINE GLUCONATE 4 % EX LIQD: Freq: Every day | CUTANEOUS | 3 refills | Status: DC | PRN

## 2019-01-04 MED ORDER — PRAVASTATIN SODIUM 20 MG PO TABS: 20.0000 mg | ORAL_TABLET | Freq: Every evening | ORAL | 3 refills | Status: DC

## 2019-01-04 MED ORDER — TESTOSTERONE CYPIONATE 200 MG/ML IM SOLN: INTRAMUSCULAR | 5 refills | Status: DC

## 2019-01-05 ENCOUNTER — Other Ambulatory Visit: Payer: Self-pay

## 2019-01-05 DIAGNOSIS — R7989 Other specified abnormal findings of blood chemistry: Secondary | ICD-10-CM

## 2019-01-05 DIAGNOSIS — N289 Disorder of kidney and ureter, unspecified: Secondary | ICD-10-CM

## 2019-01-17 ENCOUNTER — Other Ambulatory Visit: Payer: Self-pay

## 2019-01-17 ENCOUNTER — Other Ambulatory Visit (INDEPENDENT_AMBULATORY_CARE_PROVIDER_SITE_OTHER): Payer: PRIVATE HEALTH INSURANCE

## 2019-01-17 DIAGNOSIS — E291 Testicular hypofunction: Secondary | ICD-10-CM

## 2019-01-17 MED ORDER — TESTOSTERONE CYPIONATE 200 MG/ML IM SOLN
200.0000 mg | INTRAMUSCULAR | Status: DC
  Administered 2019-01-17: 200 mg via INTRAMUSCULAR

## 2019-01-18 ENCOUNTER — Other Ambulatory Visit: Payer: Self-pay | Admitting: Nephrology

## 2019-01-18 DIAGNOSIS — N183 Chronic kidney disease, stage 3 unspecified: Secondary | ICD-10-CM

## 2019-01-24 ENCOUNTER — Ambulatory Visit
Admission: RE | Admit: 2019-01-24 | Discharge: 2019-01-24 | Disposition: A | Discharge status: Home / Self Care | Payer: PRIVATE HEALTH INSURANCE | Source: Ambulatory Visit | Attending: Nephrology | Admitting: Nephrology

## 2019-01-24 DIAGNOSIS — N183 Chronic kidney disease, stage 3 unspecified: Secondary | ICD-10-CM

## 2019-01-31 ENCOUNTER — Other Ambulatory Visit (INDEPENDENT_AMBULATORY_CARE_PROVIDER_SITE_OTHER): Payer: PRIVATE HEALTH INSURANCE

## 2019-01-31 ENCOUNTER — Other Ambulatory Visit: Payer: Self-pay

## 2019-01-31 DIAGNOSIS — R7989 Other specified abnormal findings of blood chemistry: Secondary | ICD-10-CM

## 2019-01-31 MED ORDER — TESTOSTERONE ENANTHATE 200 MG/ML IM SOLN: 200.0000 mg | INTRAMUSCULAR | Status: DC

## 2019-01-31 MED ORDER — TESTOSTERONE CYPIONATE 200 MG/ML IM SOLN
200.0000 mg | INTRAMUSCULAR | Status: DC
  Administered 2019-01-31: 200 mg via INTRAMUSCULAR

## 2019-02-14 ENCOUNTER — Other Ambulatory Visit: Payer: Self-pay

## 2019-02-14 ENCOUNTER — Other Ambulatory Visit (INDEPENDENT_AMBULATORY_CARE_PROVIDER_SITE_OTHER): Payer: PRIVATE HEALTH INSURANCE

## 2019-02-14 DIAGNOSIS — R7989 Other specified abnormal findings of blood chemistry: Secondary | ICD-10-CM

## 2019-02-14 MED ORDER — TESTOSTERONE CYPIONATE 200 MG/ML IM SOLN
200.0000 mg | INTRAMUSCULAR | Status: DC
  Administered 2019-02-14: 200 mg via INTRAMUSCULAR

## 2019-02-21 ENCOUNTER — Telehealth: Payer: Self-pay | Admitting: Medical

## 2019-02-21 NOTE — Telephone Encounter (Signed)
Pt called and states Sildenafil $350, called pharmacy with Good Rx card & was $33, pt informed

## 2019-02-28 ENCOUNTER — Other Ambulatory Visit: Payer: Self-pay

## 2019-02-28 ENCOUNTER — Other Ambulatory Visit (INDEPENDENT_AMBULATORY_CARE_PROVIDER_SITE_OTHER): Payer: PRIVATE HEALTH INSURANCE

## 2019-02-28 DIAGNOSIS — E291 Testicular hypofunction: Secondary | ICD-10-CM

## 2019-02-28 MED ORDER — TESTOSTERONE CYPIONATE 200 MG/ML IM SOLN
200.0000 mg | Freq: Once | INTRAMUSCULAR | Status: AC
  Administered 2019-02-28: 200 mg via INTRAMUSCULAR

## 2019-03-14 ENCOUNTER — Other Ambulatory Visit (INDEPENDENT_AMBULATORY_CARE_PROVIDER_SITE_OTHER): Payer: PRIVATE HEALTH INSURANCE

## 2019-03-14 ENCOUNTER — Other Ambulatory Visit: Payer: Self-pay

## 2019-03-14 DIAGNOSIS — E291 Testicular hypofunction: Secondary | ICD-10-CM

## 2019-03-14 MED ORDER — TESTOSTERONE CYPIONATE 200 MG/ML IM SOLN
200.0000 mg | INTRAMUSCULAR | Status: DC
  Administered 2019-03-14: 200 mg via INTRAMUSCULAR

## 2019-03-28 ENCOUNTER — Other Ambulatory Visit: Payer: PRIVATE HEALTH INSURANCE

## 2019-03-28 ENCOUNTER — Other Ambulatory Visit: Payer: Self-pay

## 2019-03-28 ENCOUNTER — Other Ambulatory Visit (INDEPENDENT_AMBULATORY_CARE_PROVIDER_SITE_OTHER): Payer: PRIVATE HEALTH INSURANCE

## 2019-03-28 DIAGNOSIS — E291 Testicular hypofunction: Secondary | ICD-10-CM | POA: Diagnosis not present

## 2019-03-28 MED ORDER — TESTOSTERONE CYPIONATE 200 MG/ML IM SOLN
200.0000 mg | Freq: Once | INTRAMUSCULAR | Status: AC
  Administered 2019-03-28: 200 mg via INTRAMUSCULAR

## 2019-04-11 ENCOUNTER — Other Ambulatory Visit (INDEPENDENT_AMBULATORY_CARE_PROVIDER_SITE_OTHER): Payer: PRIVATE HEALTH INSURANCE

## 2019-04-11 ENCOUNTER — Other Ambulatory Visit: Payer: Self-pay

## 2019-04-11 DIAGNOSIS — R7989 Other specified abnormal findings of blood chemistry: Secondary | ICD-10-CM | POA: Diagnosis not present

## 2019-04-11 MED ORDER — TESTOSTERONE CYPIONATE 200 MG/ML IM SOLN
200.0000 mg | Freq: Once | INTRAMUSCULAR | Status: AC
  Administered 2019-04-11: 200 mg via INTRAMUSCULAR

## 2019-04-25 ENCOUNTER — Other Ambulatory Visit: Payer: Self-pay

## 2019-04-25 ENCOUNTER — Other Ambulatory Visit: Payer: PRIVATE HEALTH INSURANCE

## 2019-04-25 ENCOUNTER — Other Ambulatory Visit (INDEPENDENT_AMBULATORY_CARE_PROVIDER_SITE_OTHER): Payer: PRIVATE HEALTH INSURANCE

## 2019-04-25 DIAGNOSIS — E291 Testicular hypofunction: Secondary | ICD-10-CM | POA: Diagnosis not present

## 2019-04-25 MED ORDER — TESTOSTERONE CYPIONATE 200 MG/ML IM SOLN
200.0000 mg | INTRAMUSCULAR | Status: DC
  Administered 2019-04-25 – 2020-04-23 (×5): 200 mg via INTRAMUSCULAR

## 2019-04-29 ENCOUNTER — Other Ambulatory Visit: Payer: Self-pay | Admitting: Medical

## 2019-05-09 ENCOUNTER — Other Ambulatory Visit: Payer: PRIVATE HEALTH INSURANCE

## 2019-05-09 ENCOUNTER — Other Ambulatory Visit: Payer: Self-pay

## 2019-05-09 DIAGNOSIS — R7989 Other specified abnormal findings of blood chemistry: Secondary | ICD-10-CM

## 2019-05-09 MED ORDER — TESTOSTERONE CYPIONATE 200 MG/ML IM SOLN: 200.0000 mg | Freq: Once | INTRAMUSCULAR | Status: DC

## 2019-05-10 ENCOUNTER — Other Ambulatory Visit: Payer: Self-pay | Admitting: Medical

## 2019-05-17 ENCOUNTER — Other Ambulatory Visit: Payer: Self-pay | Admitting: Medical

## 2019-05-23 ENCOUNTER — Other Ambulatory Visit: Payer: Self-pay | Admitting: Medical

## 2019-05-23 ENCOUNTER — Other Ambulatory Visit (INDEPENDENT_AMBULATORY_CARE_PROVIDER_SITE_OTHER): Payer: PRIVATE HEALTH INSURANCE

## 2019-05-23 ENCOUNTER — Other Ambulatory Visit: Payer: Self-pay

## 2019-05-23 DIAGNOSIS — R7989 Other specified abnormal findings of blood chemistry: Secondary | ICD-10-CM

## 2019-05-23 DIAGNOSIS — E291 Testicular hypofunction: Secondary | ICD-10-CM

## 2019-05-23 MED ORDER — TESTOSTERONE CYPIONATE 200 MG/ML IM SOLN
200.0000 mg | INTRAMUSCULAR | Status: DC
  Administered 2019-05-23: 09:00:00 200 mg via INTRAMUSCULAR

## 2019-05-24 LAB — TESTOSTERONE,FREE AND TOTAL
Testosterone, Free: 17.5 pg/mL (ref 7.2–24.0)
Testosterone: 483 ng/dL (ref 264–916)

## 2019-05-25 ENCOUNTER — Other Ambulatory Visit: Payer: Self-pay | Admitting: Medical

## 2019-06-03 ENCOUNTER — Other Ambulatory Visit: Payer: Self-pay | Admitting: Medical

## 2019-06-10 ENCOUNTER — Other Ambulatory Visit: Payer: Self-pay | Admitting: Medical

## 2019-06-13 ENCOUNTER — Other Ambulatory Visit (INDEPENDENT_AMBULATORY_CARE_PROVIDER_SITE_OTHER): Payer: PRIVATE HEALTH INSURANCE

## 2019-06-13 ENCOUNTER — Other Ambulatory Visit: Payer: Self-pay

## 2019-06-13 DIAGNOSIS — E291 Testicular hypofunction: Secondary | ICD-10-CM | POA: Diagnosis not present

## 2019-06-13 MED ORDER — TESTOSTERONE CYPIONATE 200 MG/ML IM SOLN
200.0000 mg | INTRAMUSCULAR | Status: DC
  Administered 2019-06-13 – 2019-08-29 (×2): 200 mg via INTRAMUSCULAR

## 2019-06-13 NOTE — Telephone Encounter (Signed)
Is this ok to refill?  

## 2019-06-25 ENCOUNTER — Other Ambulatory Visit: Payer: Self-pay | Admitting: Medical

## 2019-07-04 ENCOUNTER — Other Ambulatory Visit (INDEPENDENT_AMBULATORY_CARE_PROVIDER_SITE_OTHER): Payer: PRIVATE HEALTH INSURANCE

## 2019-07-04 ENCOUNTER — Other Ambulatory Visit: Payer: Self-pay

## 2019-07-04 DIAGNOSIS — E291 Testicular hypofunction: Secondary | ICD-10-CM | POA: Diagnosis not present

## 2019-07-04 MED ORDER — TESTOSTERONE CYPIONATE 200 MG/ML IM SOLN
200.0000 mg | Freq: Once | INTRAMUSCULAR | Status: AC
  Administered 2019-07-04: 09:00:00 200 mg via INTRAMUSCULAR

## 2019-07-15 ENCOUNTER — Other Ambulatory Visit: Payer: Self-pay | Admitting: Medical

## 2019-07-26 ENCOUNTER — Other Ambulatory Visit: Payer: Self-pay | Admitting: Medical

## 2019-08-08 ENCOUNTER — Other Ambulatory Visit (INDEPENDENT_AMBULATORY_CARE_PROVIDER_SITE_OTHER): Payer: Managed Care, Other (non HMO)

## 2019-08-08 ENCOUNTER — Other Ambulatory Visit: Payer: Self-pay

## 2019-08-08 DIAGNOSIS — E291 Testicular hypofunction: Secondary | ICD-10-CM | POA: Diagnosis not present

## 2019-08-08 MED ORDER — TESTOSTERONE CYPIONATE 200 MG/ML IM SOLN
200.0000 mg | Freq: Once | INTRAMUSCULAR | Status: AC
  Administered 2019-08-08: 200 mg via INTRAMUSCULAR

## 2019-08-29 ENCOUNTER — Other Ambulatory Visit (INDEPENDENT_AMBULATORY_CARE_PROVIDER_SITE_OTHER): Payer: Managed Care, Other (non HMO)

## 2019-08-29 ENCOUNTER — Other Ambulatory Visit: Payer: Self-pay

## 2019-08-29 ENCOUNTER — Other Ambulatory Visit: Payer: Managed Care, Other (non HMO)

## 2019-08-29 DIAGNOSIS — E291 Testicular hypofunction: Secondary | ICD-10-CM

## 2019-09-19 ENCOUNTER — Other Ambulatory Visit: Payer: Self-pay

## 2019-09-19 ENCOUNTER — Other Ambulatory Visit (INDEPENDENT_AMBULATORY_CARE_PROVIDER_SITE_OTHER): Payer: Managed Care, Other (non HMO)

## 2019-09-19 DIAGNOSIS — E291 Testicular hypofunction: Secondary | ICD-10-CM | POA: Diagnosis not present

## 2019-09-19 MED ORDER — TESTOSTERONE CYPIONATE 200 MG/ML IM SOLN
200.0000 mg | INTRAMUSCULAR | Status: DC
  Administered 2019-09-19: 200 mg via INTRAMUSCULAR

## 2019-10-10 ENCOUNTER — Other Ambulatory Visit (INDEPENDENT_AMBULATORY_CARE_PROVIDER_SITE_OTHER): Payer: Managed Care, Other (non HMO)

## 2019-10-10 ENCOUNTER — Other Ambulatory Visit: Payer: Self-pay

## 2019-10-10 DIAGNOSIS — E291 Testicular hypofunction: Secondary | ICD-10-CM

## 2019-10-10 MED ORDER — TESTOSTERONE CYPIONATE 200 MG/ML IM SOLN
200.0000 mg | INTRAMUSCULAR | Status: DC
  Administered 2019-10-10: 200 mg via INTRAMUSCULAR

## 2019-10-12 ENCOUNTER — Other Ambulatory Visit: Payer: Self-pay | Admitting: Medical

## 2019-10-12 NOTE — Telephone Encounter (Signed)
He is due for CPE in June and doesn't have one scheduled. Please schedule physical, then okay to fill this rx as pended (#60 no refills)

## 2019-10-12 NOTE — Telephone Encounter (Signed)
Lmom for patient to call ands schedule appointment for physical.

## 2019-10-24 ENCOUNTER — Other Ambulatory Visit: Payer: Self-pay | Admitting: Medical

## 2019-10-31 ENCOUNTER — Other Ambulatory Visit (INDEPENDENT_AMBULATORY_CARE_PROVIDER_SITE_OTHER): Payer: 59

## 2019-10-31 ENCOUNTER — Other Ambulatory Visit: Payer: Self-pay

## 2019-10-31 DIAGNOSIS — E291 Testicular hypofunction: Secondary | ICD-10-CM | POA: Diagnosis not present

## 2019-10-31 MED ORDER — TESTOSTERONE CYPIONATE 200 MG/ML IM SOLN
200.0000 mg | INTRAMUSCULAR | Status: DC
  Administered 2019-10-31 – 2019-12-19 (×2): 200 mg via INTRAMUSCULAR

## 2019-11-21 ENCOUNTER — Other Ambulatory Visit: Payer: Self-pay

## 2019-11-21 ENCOUNTER — Other Ambulatory Visit (INDEPENDENT_AMBULATORY_CARE_PROVIDER_SITE_OTHER): Payer: 59

## 2019-11-21 DIAGNOSIS — R7989 Other specified abnormal findings of blood chemistry: Secondary | ICD-10-CM | POA: Diagnosis not present

## 2019-11-21 DIAGNOSIS — E291 Testicular hypofunction: Secondary | ICD-10-CM

## 2019-11-21 MED ORDER — TESTOSTERONE CYPIONATE 200 MG/ML IM SOLN
200.0000 mg | Freq: Once | INTRAMUSCULAR | Status: AC
  Administered 2019-11-21: 200 mg via INTRAMUSCULAR

## 2019-12-04 ENCOUNTER — Other Ambulatory Visit: Payer: Self-pay | Admitting: Medical

## 2019-12-13 ENCOUNTER — Other Ambulatory Visit: Payer: 59

## 2019-12-18 ENCOUNTER — Other Ambulatory Visit: Payer: Self-pay | Admitting: Medical

## 2019-12-19 ENCOUNTER — Other Ambulatory Visit: Payer: Self-pay

## 2019-12-19 ENCOUNTER — Other Ambulatory Visit (INDEPENDENT_AMBULATORY_CARE_PROVIDER_SITE_OTHER): Payer: 59

## 2019-12-19 DIAGNOSIS — E291 Testicular hypofunction: Secondary | ICD-10-CM | POA: Diagnosis not present

## 2019-12-19 NOTE — Telephone Encounter (Signed)
Patient has scheduled a physical for 01/09/20.

## 2020-01-09 ENCOUNTER — Other Ambulatory Visit: Payer: Self-pay

## 2020-01-09 ENCOUNTER — Other Ambulatory Visit: Payer: 59

## 2020-01-09 ENCOUNTER — Other Ambulatory Visit (INDEPENDENT_AMBULATORY_CARE_PROVIDER_SITE_OTHER): Payer: 59

## 2020-01-09 DIAGNOSIS — E291 Testicular hypofunction: Secondary | ICD-10-CM | POA: Diagnosis not present

## 2020-01-10 ENCOUNTER — Other Ambulatory Visit: Payer: Self-pay | Admitting: Medical

## 2020-01-23 ENCOUNTER — Encounter: Payer: Self-pay | Admitting: Medical

## 2020-01-23 ENCOUNTER — Other Ambulatory Visit: Payer: Self-pay

## 2020-01-23 ENCOUNTER — Ambulatory Visit: Payer: 59 | Admitting: Medical

## 2020-01-23 VITALS — BP 158/106 | HR 97 | Ht 74.0 in | Wt 174.8 lb

## 2020-01-23 DIAGNOSIS — Z7189 Other specified counseling: Secondary | ICD-10-CM

## 2020-01-23 DIAGNOSIS — D369 Benign neoplasm, unspecified site: Secondary | ICD-10-CM

## 2020-01-23 DIAGNOSIS — Z0000 Encounter for general adult medical examination without abnormal findings: Secondary | ICD-10-CM

## 2020-01-23 DIAGNOSIS — N289 Disorder of kidney and ureter, unspecified: Secondary | ICD-10-CM | POA: Diagnosis not present

## 2020-01-23 DIAGNOSIS — K635 Polyp of colon: Secondary | ICD-10-CM | POA: Diagnosis not present

## 2020-01-23 DIAGNOSIS — Z7185 Encounter for immunization safety counseling: Secondary | ICD-10-CM

## 2020-01-23 DIAGNOSIS — Z125 Encounter for screening for malignant neoplasm of prostate: Secondary | ICD-10-CM

## 2020-01-23 DIAGNOSIS — Z79899 Other long term (current) drug therapy: Secondary | ICD-10-CM

## 2020-01-23 DIAGNOSIS — E785 Hyperlipidemia, unspecified: Secondary | ICD-10-CM

## 2020-01-23 DIAGNOSIS — E291 Testicular hypofunction: Secondary | ICD-10-CM

## 2020-01-23 DIAGNOSIS — N529 Male erectile dysfunction, unspecified: Secondary | ICD-10-CM

## 2020-01-23 DIAGNOSIS — R7989 Other specified abnormal findings of blood chemistry: Secondary | ICD-10-CM

## 2020-01-23 DIAGNOSIS — R351 Nocturia: Secondary | ICD-10-CM | POA: Insufficient documentation

## 2020-01-23 NOTE — Progress Notes (Signed)
Subjective:   HPI  Shawn Pena is a 54 y.o. male who presents for Chief Complaint  Patient presents with  . Annual Exam    with fasting labs     Patient Care Team: Haneen Bernales, Camelia Eng, PA-C as PCP - General (Family Medicine) Sees dentist Sees eye doctor [name], [facility]   Concerns: Doing fine of late other than some family stress   Reviewed their medical, surgical, family, social, medication, and allergy history and updated chart as appropriate.  Past Medical History:  Diagnosis Date  . Glaucoma   . Hyperlipidemia   . Hypertension   . Hypogonadism male   . Low testosterone     Past Surgical History:  Procedure Laterality Date  . COLONOSCOPY  10/2017   Tubular Adenoma, repeat 5 years, [name]  . THROAT SURGERY     vocal cord polyp  . WISDOM TOOTH EXTRACTION      Social History   Socioeconomic History  . Marital status: Married    Spouse name: Not on file  . Number of children: Not on file  . Years of education: Not on file  . Highest education level: Not on file  Occupational History  . Not on file  Tobacco Use  . Smoking status: Former Smoker    Quit date: 07/14/1997    Years since quitting: 22.5  . Smokeless tobacco: Never Used  Vaping Use  . Vaping Use: Never used  Substance and Sexual Activity  . Alcohol use: Yes    Comment: "very littl"  . Drug use: No  . Sexual activity: Never  Other Topics Concern  . Not on file  Social History Narrative   Lives with wife, great nephew sometimes stays with them.  Exercise - walks some.   Stephanie Coup.  12/2017   Social Determinants of Health   Financial Resource Strain:   . Difficulty of Paying Living Expenses:   Food Insecurity:   . Worried About Charity fundraiser in the Last Year:   . Arboriculturist in the Last Year:   Transportation Needs:   . Film/video editor (Medical):   Marland Kitchen Lack of Transportation (Non-Medical):   Physical Activity:   . Days of Exercise per  Week:   . Minutes of Exercise per Session:   Stress:   . Feeling of Stress :   Social Connections:   . Frequency of Communication with Friends and Family:   . Frequency of Social Gatherings with Friends and Family:   . Attends Religious Services:   . Active Member of Clubs or Organizations:   . Attends Archivist Meetings:   Marland Kitchen Marital Status:   Intimate Partner Violence:   . Fear of Current or Ex-Partner:   . Emotionally Abused:   Marland Kitchen Physically Abused:   . Sexually Abused:     Family History  Problem Relation Age of Onset  . Kidney disease Mother   . Heart disease Mother   . Hypertension Mother   . Diabetes Mother   . Hypertension Father   . Hypertension Sister   . Diabetes Sister   . Hypertension Brother   . Cancer Maternal Grandmother        stomach  . Stomach cancer Maternal Grandmother   . Cancer Maternal Grandfather        prostate  . Prostate cancer Maternal Grandfather   . Cancer Paternal Grandmother        ovarian  . Ovarian  cancer Paternal Grandmother   . Stroke Paternal Grandfather   . Esophageal cancer Maternal Uncle   . Colon cancer Neg Hx   . Colon polyps Neg Hx   . Rectal cancer Neg Hx      Current Outpatient Medications:  .  brimonidine-timolol (COMBIGAN) 0.2-0.5 % ophthalmic solution, Place 2 drops into both eyes every 12 (twelve) hours., Disp: , Rfl:  .  clindamycin-benzoyl peroxide (BENZACLIN) gel, APPLY TO AFFECTED AREA TWICE A DAY, Disp: 25 g, Rfl: 0 .  irbesartan-hydrochlorothiazide (AVALIDE) 150-12.5 MG tablet, Take 1 tablet by mouth daily., Disp: 90 tablet, Rfl: 3 .  pravastatin (PRAVACHOL) 20 MG tablet, TAKE 1 TABLET BY MOUTH EVERY DAY IN THE EVENING, Disp: 90 tablet, Rfl: 1 .  ROCKLATAN 0.02-0.005 % SOLN, , Disp: , Rfl:  .  sildenafil (REVATIO) 20 MG tablet, TAKE 2 TABLETS BY MOUTH ONCE DAILY AS NEEDED PRIOR  TO  SEXUAL  ACTIVITY, Disp: 60 tablet, Rfl: 1 .  testosterone cypionate (DEPOTESTOSTERONE CYPIONATE) 200 MG/ML injection,  INJECT 1 ML INTO THE MUSCLE EVERY 14 DAYS, Disp: 2 mL, Rfl: 3 .  chlorhexidine (HIBICLENS) 4 % external liquid, Apply topically daily as needed. (Patient not taking: Reported on 01/23/2020), Disp: 120 mL, Rfl: 3 .  mupirocin ointment (BACTROBAN) 2 %, Place 1 application into the nose 3 (three) times daily. (Patient not taking: Reported on 10/12/2018), Disp: 22 g, Rfl: 0 .  RHOPRESSA 0.02 % SOLN, Apply 1 drop to eye at bedtime. (Patient not taking: Reported on 01/23/2020), Disp: , Rfl:  .  Travoprost, BAK Free, (TRAVATAN) 0.004 % SOLN ophthalmic solution, , Disp: , Rfl:   Current Facility-Administered Medications:  .  testosterone cypionate (DEPOTESTOSTERONE CYPIONATE) injection 200 mg, 200 mg, Intramuscular, Q21 days, Kaiven Vester, Camelia Eng, PA-C, 200 mg at 01/23/20 1655 .  testosterone cypionate (DEPOTESTOSTERONE CYPIONATE) injection 200 mg, 200 mg, Intramuscular, Once, Tamyia Minich, Camelia Eng, PA-C .  testosterone cypionate (DEPOTESTOSTERONE CYPIONATE) injection 200 mg, 200 mg, Intramuscular, Q14 Days, Daymond Cordts, Camelia Eng, PA-C, 200 mg at 05/23/19 0855 .  testosterone cypionate (DEPOTESTOSTERONE CYPIONATE) injection 200 mg, 200 mg, Intramuscular, Q21 days, Alida Greiner, Camelia Eng, PA-C, 200 mg at 08/29/19 1640 .  testosterone cypionate (DEPOTESTOSTERONE CYPIONATE) injection 200 mg, 200 mg, Intramuscular, Q14 Days, Dierdra Salameh, Camelia Eng, PA-C, 200 mg at 09/19/19 0919 .  testosterone cypionate (DEPOTESTOSTERONE CYPIONATE) injection 200 mg, 200 mg, Intramuscular, Q14 Days, Samary Shatz, Camelia Eng, PA-C, 200 mg at 10/10/19 9509 .  testosterone cypionate (DEPOTESTOSTERONE CYPIONATE) injection 200 mg, 200 mg, Intramuscular, Q14 Days, Kysa Calais, Camelia Eng, PA-C, 200 mg at 12/19/19 3267  Allergies  Allergen Reactions  . Penicillins Rash       Review of Systems Constitutional: -fever, -chills, -sweats, -unexpected weight change, -decreased appetite, -fatigue Allergy: -sneezing, -itching, -congestion Dermatology: -changing moles,-  rash, -lumps ENT: -runny nose, -ear pain, -sore throat, -hoarseness, -sinus pain, -teeth pain, - ringing in ears, -hearing loss, -nosebleeds Cardiology: -chest pain, -palpitations, -swelling, -difficulty breathing when lying flat, -waking up short of breath Respiratory: -cough, -shortness of breath, -difficulty breathing with exercise or exertion, -wheezing, -coughing up blood Gastroenterology: -abdominal pain, -nausea, -vomiting, -diarrhea, -constipation, -blood in stool, -changes in bowel movement, -difficulty swallowing or eating Hematology: -bleeding, -bruising  Musculoskeletal: -joint aches, -muscle aches, -joint swelling, -back pain, -neck pain, -cramping, -changes in gait Ophthalmology: denies vision changes, eye redness, itching, discharge Urology: -burning with urination, -difficulty urinating, -blood in urine, -urinary frequency, -urgency, -incontinence Neurology: -headache, -weakness, -tingling, -numbness, -memory loss, -falls, -dizziness Psychology: -depressed mood, -agitation, -sleep problems  Male GU: no testicular mass, pain, no lymph nodes swollen, no swelling, no rash.     Objective:  BP (!) 158/106   Pulse 97   Ht 6\' 2"  (1.88 m)   Wt 174 lb 12.8 oz (79.3 kg)   SpO2 96%   BMI 22.44 kg/m   General appearance: alert, no distress, WD/WN, African American male Skin: no worrisome lesions Neck: supple, no lymphadenopathy, no thyromegaly, no masses, normal ROM, no bruits Chest: non tender, normal shape and expansion Heart: RRR, normal S1, S2, no murmurs Lungs: CTA bilaterally, no wheezes, rhonchi, or rales Abdomen: +bs, soft, non tender, non distended, no masses, no hepatomegaly, no splenomegaly, no bruits Back: non tender, normal ROM, no scoliosis Musculoskeletal: upper extremities non tender, no obvious deformity, normal ROM throughout, lower extremities non tender, no obvious deformity, normal ROM throughout Extremities: no edema, no cyanosis, no clubbing Pulses: 2+  symmetric, upper and lower extremities, normal cap refill Neurological: alert, oriented x 3, CN2-12 intact, strength normal upper extremities and lower extremities, sensation normal throughout, DTRs 2+ throughout, no cerebellar signs, gait normal Psychiatric: normal affect, behavior normal, pleasant  GU: normal male external genitalia,circumcised, +hypogonadism, non tender, no masses, no hernia, no lymphadenopathy Rectal: small external hemorrhoids, normal tone, prostate seems mildly enlarged   Assessment and Plan :   Encounter Diagnoses  Name Primary?  . Encounter for health maintenance examination in adult Yes  . Hypogonadism in male   . Polyp of colon, unspecified part of colon, unspecified type   . Renal insufficiency   . Low testosterone   . Screening for prostate cancer   . Tubular adenoma   . Erectile dysfunction, unspecified erectile dysfunction type   . Vaccine counseling   . Hyperlipidemia, unspecified hyperlipidemia type   . High risk medication use   . Nocturia     Physical exam - discussed and counseled on healthy lifestyle, diet, exercise, preventative care, vaccinations, sick and well care, proper use of emergency dept and after hours care, and addressed their concerns.    Health screening: See your eye doctor yearly for routine vision care. See your dentist yearly for routine dental care including hygiene visits twice yearly.  Cancer screening Colonoscopy:  Reviewed colonoscopy on file that is up to date  Discussed PSA, prostate exam, and prostate cancer screening risks/benefits.     Vaccines:  I recommend you have a yearly flu shot in the fall  I recommend the Covid vaccine  Shingles vaccine:  I recommend you have a shingles vaccine to help prevent shingles or herpes zoster outbreak.   Please call your insurer to inquire about coverage for the Shingrix vaccine given in 2 doses.   Some insurers cover this vaccine after age 39, some cover this after age 36.   If your insurer covers this, then call to schedule appointment to have this vaccine here.  You are up-to-date on tetanus   Separate significant chronic issues discussed: We discussed his nephrology note from last year which I do not have a copy of that he was advised to cut back to 1/2 tablet of his combo blood pressure medication including diuretic, thus he has just been doing 1/2 tablet daily.  He does not check his blood pressures  Hypogonadism, low testosterone-screening surveillance labs today  Nocturia-discussed possible causes.  Follow-up pending labs  HTN - c/t current therapy   hyperlipidemia - f/u pending labs   Caedmon was seen today for annual exam.  Diagnoses and all orders for this visit:  Encounter for health maintenance examination in adult -     Lipid panel -     Comprehensive metabolic panel -     CBC with Differential/Platelet -     Testosterone -     PSA  Hypogonadism in male  Polyp of colon, unspecified part of colon, unspecified type  Renal insufficiency  Low testosterone -     Testosterone  Screening for prostate cancer -     PSA  Tubular adenoma  Erectile dysfunction, unspecified erectile dysfunction type  Vaccine counseling  Hyperlipidemia, unspecified hyperlipidemia type -     Lipid panel  High risk medication use  Nocturia   Follow-up pending labs, yearly for physical

## 2020-01-24 ENCOUNTER — Other Ambulatory Visit: Payer: Self-pay | Admitting: Medical

## 2020-01-24 LAB — CBC WITH DIFFERENTIAL/PLATELET
Basophils Absolute: 0.1 10*3/uL (ref 0.0–0.2)
Basos: 1 %
EOS (ABSOLUTE): 0.1 10*3/uL (ref 0.0–0.4)
Eos: 2 %
Hematocrit: 47.9 % (ref 37.5–51.0)
Hemoglobin: 16 g/dL (ref 13.0–17.7)
Immature Grans (Abs): 0 10*3/uL (ref 0.0–0.1)
Immature Granulocytes: 0 %
Lymphocytes Absolute: 2.7 10*3/uL (ref 0.7–3.1)
Lymphs: 36 %
MCH: 30.1 pg (ref 26.6–33.0)
MCHC: 33.4 g/dL (ref 31.5–35.7)
MCV: 90 fL (ref 79–97)
Monocytes Absolute: 0.7 10*3/uL (ref 0.1–0.9)
Monocytes: 10 %
Neutrophils Absolute: 4 10*3/uL (ref 1.4–7.0)
Neutrophils: 51 %
Platelets: 201 10*3/uL (ref 150–450)
RBC: 5.31 x10E6/uL (ref 4.14–5.80)
RDW: 12.1 % (ref 11.6–15.4)
WBC: 7.6 10*3/uL (ref 3.4–10.8)

## 2020-01-24 LAB — COMPREHENSIVE METABOLIC PANEL
ALT: 20 IU/L (ref 0–44)
AST: 24 IU/L (ref 0–40)
Albumin/Globulin Ratio: 1.5 (ref 1.2–2.2)
Albumin: 4.8 g/dL (ref 3.8–4.9)
Alkaline Phosphatase: 60 IU/L (ref 48–121)
BUN/Creatinine Ratio: 12 (ref 9–20)
BUN: 21 mg/dL (ref 6–24)
Bilirubin Total: 0.8 mg/dL (ref 0.0–1.2)
CO2: 23 mmol/L (ref 20–29)
Calcium: 10.1 mg/dL (ref 8.7–10.2)
Chloride: 102 mmol/L (ref 96–106)
Creatinine, Ser: 1.69 mg/dL — ABNORMAL HIGH (ref 0.76–1.27)
GFR calc Af Amer: 52 mL/min/{1.73_m2} — ABNORMAL LOW (ref >59)
GFR calc non Af Amer: 45 mL/min/{1.73_m2} — ABNORMAL LOW (ref >59)
Globulin, Total: 3.3 g/dL (ref 1.5–4.5)
Glucose: 86 mg/dL (ref 65–99)
Potassium: 4.5 mmol/L (ref 3.5–5.2)
Sodium: 139 mmol/L (ref 134–144)
Total Protein: 8.1 g/dL (ref 6.0–8.5)

## 2020-01-24 LAB — LIPID PANEL
Chol/HDL Ratio: 3.3 ratio (ref 0.0–5.0)
Cholesterol, Total: 156 mg/dL (ref 100–199)
HDL: 48 mg/dL (ref >39)
LDL Chol Calc (NIH): 94 mg/dL (ref 0–99)
Triglycerides: 70 mg/dL (ref 0–149)
VLDL Cholesterol Cal: 14 mg/dL (ref 5–40)

## 2020-01-24 LAB — TESTOSTERONE: Testosterone: 482 ng/dL (ref 264–916)

## 2020-01-24 LAB — PSA: Prostate Specific Ag, Serum: 2.2 ng/mL (ref 0.0–4.0)

## 2020-01-24 MED ORDER — SILDENAFIL CITRATE 20 MG PO TABS: ORAL_TABLET | ORAL | 11 refills | Status: DC

## 2020-01-24 MED ORDER — TESTOSTERONE CYPIONATE 200 MG/ML IM SOLN: 200.0000 mg | INTRAMUSCULAR | 5 refills | Status: DC

## 2020-01-24 MED ORDER — DOXAZOSIN MESYLATE 1 MG PO TABS
1.0000 mg | ORAL_TABLET | Freq: Every day | ORAL | 0 refills | Status: DC
Start: 2020-01-24 — End: 2020-02-20

## 2020-01-24 MED ORDER — IRBESARTAN 75 MG PO TABS
75.0000 mg | ORAL_TABLET | Freq: Every day | ORAL | 0 refills | Status: DC
Start: 2020-01-24 — End: 2020-02-20

## 2020-01-30 ENCOUNTER — Other Ambulatory Visit: Payer: 59

## 2020-01-30 ENCOUNTER — Telehealth: Payer: Self-pay | Admitting: Internal Medicine

## 2020-01-30 MED ORDER — SILDENAFIL CITRATE 20 MG PO TABS: ORAL_TABLET | ORAL | 11 refills | Status: DC

## 2020-01-30 NOTE — Telephone Encounter (Signed)
Pt called and states that the sidenafil 20mg  is too expensive and wants it to sent to [facility]

## 2020-02-13 ENCOUNTER — Ambulatory Visit: Payer: 59 | Admitting: Medical

## 2020-02-13 ENCOUNTER — Other Ambulatory Visit: Payer: Self-pay

## 2020-02-13 ENCOUNTER — Encounter: Payer: Self-pay | Admitting: Medical

## 2020-02-13 VITALS — BP 142/88 | HR 73 | Ht 74.0 in | Wt 180.4 lb

## 2020-02-13 DIAGNOSIS — M7551 Bursitis of right shoulder: Secondary | ICD-10-CM | POA: Diagnosis not present

## 2020-02-13 DIAGNOSIS — E291 Testicular hypofunction: Secondary | ICD-10-CM

## 2020-02-13 DIAGNOSIS — M25511 Pain in right shoulder: Secondary | ICD-10-CM

## 2020-02-13 MED ORDER — HYDROCODONE-ACETAMINOPHEN 7.5-325 MG PO TABS: 1.0000 | ORAL_TABLET | Freq: Four times a day (QID) | ORAL | 0 refills | Status: AC | PRN

## 2020-02-13 NOTE — Progress Notes (Signed)
Subjective:  Shawn Pena is a 54 y.o. male who presents for Chief Complaint  Patient presents with  . Shoulder Pain    right x1 week     Here for right shoulder pain.   He is right handed, Art gallery manager.   Saturday a week and a few days ago started having pain, can't lift arm very well side, front or back over 90 degrees.  Tried tylenol, CBD rub.   occasionally gets numb or tingling if sleeps on arms, but otherwise no.   No swelling.   No heavy lifting, no recent strenuous activity.  No other aggravating or relieving factors.    No other c/o.  The following portions of the patient's history were reviewed and updated as appropriate: allergies, current medications, past family history, past medical history, past social history, past surgical history and problem list.  ROS Otherwise as in subjective above    Objective: BP (!) 142/88   Pulse 73   Ht 6\' 2"  (1.88 m)   Wt 180 lb 6.4 oz (81.8 kg)   SpO2 96%   BMI 23.16 kg/m   BP Readings from Last 3 Encounters:  02/13/20 (!) 142/88  01/23/20 (!) 158/106  01/03/19 120/70    General appearance: alert, no distress, well developed, well nourished Neck: soft, nontender, normal room, no mass, no thyromegaly, no lad MSK: right shoulder tender over biceps tendon origin, mildly over AC joint, and mildly over posterior shoulder, mild pain with crossover tests, mild pain with shoulder ROM over 90 degree although ROM is 90% or more, no laxity, no weakness with special tests,  Otherwise arm nontender and normal ROM Left arm unremarkable Mild tenderness in right upper back paraspinal, otherwise nontender Arms neurovascularly intact    Assessment: Encounter Diagnoses  Name Primary?  . Acute pain of right shoulder Yes  . Bursitis of right shoulder      Plan: symptoms and exam suggest right shoulder bursitis.   Given his CKD, we will avoid NSAIDs.  Begin short term medication below prn, discussed risk and benefits of medication.  Advised  relative rest including using arm sling periodically for most of this week.  Use ice water bag or bag of frozen peas for cold therapy 20 minutes at a time, when possible.  If not much improved by the end of the week consider other therapies which could include steroid injection, referral to PT or sports medicine, steroid Dosepak.   F/u otherwise prn  Rondo was seen today for shoulder pain.  Diagnoses and all orders for this visit:  Acute pain of right shoulder  Bursitis of right shoulder  Other orders -     HYDROcodone-acetaminophen (NORCO) 7.5-325 MG tablet; Take 1 tablet by mouth every 6 (six) hours as needed for up to 5 days for moderate pain.    Follow up: prn

## 2020-02-20 ENCOUNTER — Other Ambulatory Visit: Payer: Self-pay | Admitting: Medical

## 2020-02-27 ENCOUNTER — Ambulatory Visit: Payer: 59 | Admitting: Medical

## 2020-03-02 IMAGING — US US RENAL
1 series · 13 of 25 positions shown · non-contrast
Comparison: None.

CLINICAL DATA: Stage III chronic renal disease

EXAM:
RENAL / URINARY TRACT ULTRASOUND COMPLETE

[Series 1: us renal · 0.20mm/px · 13 of 62 slices shown]
[im 1/62]
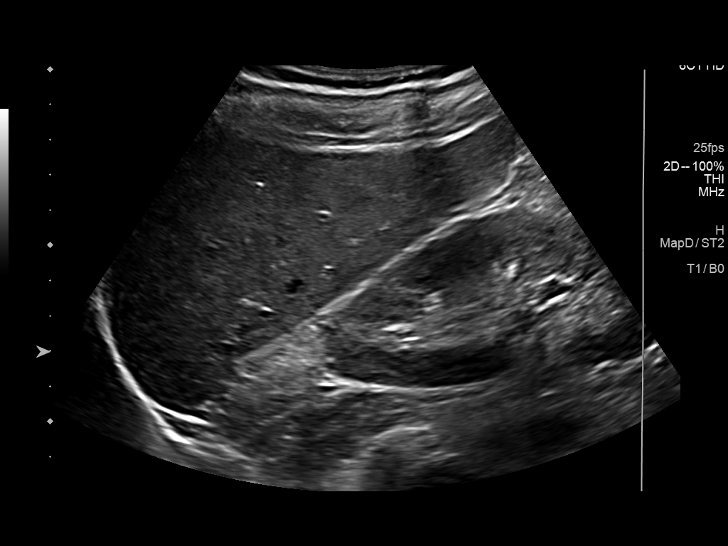
[im 6/62]
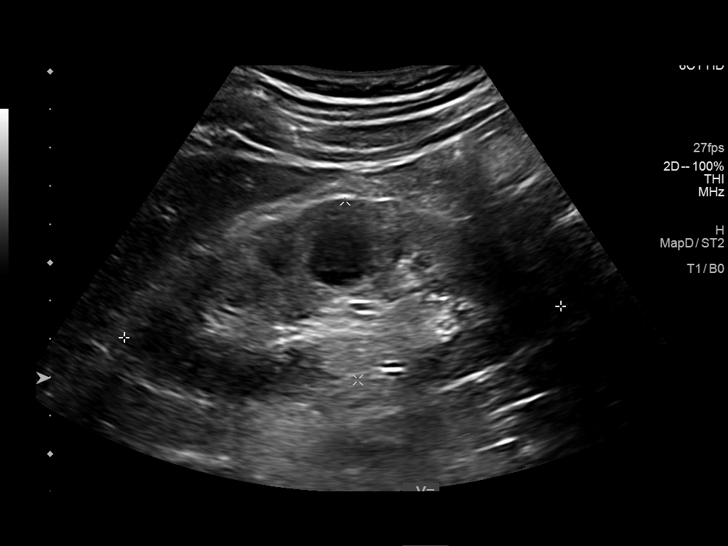
[im 11/62]
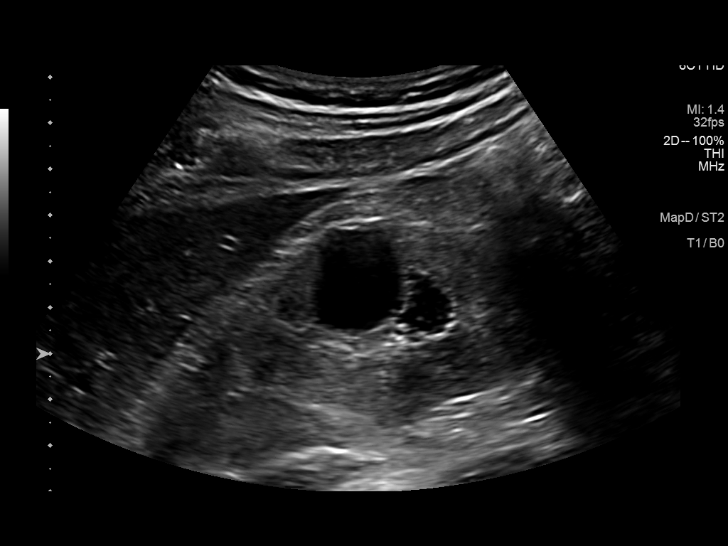
[im 16/62]
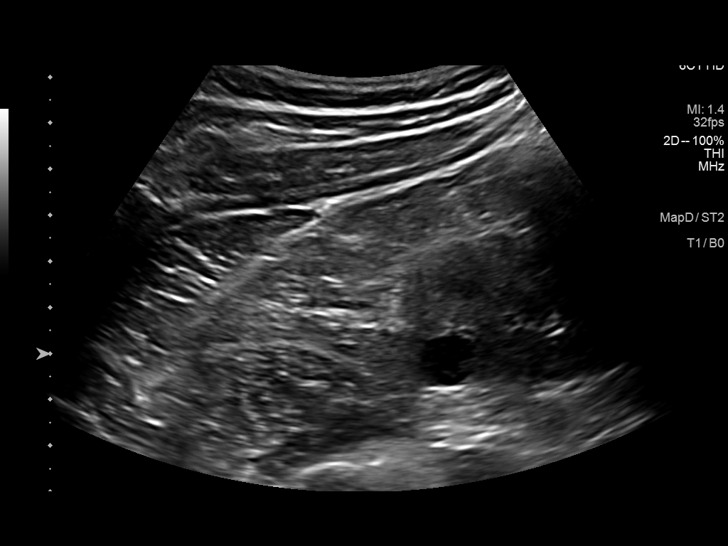
[im 21/62]
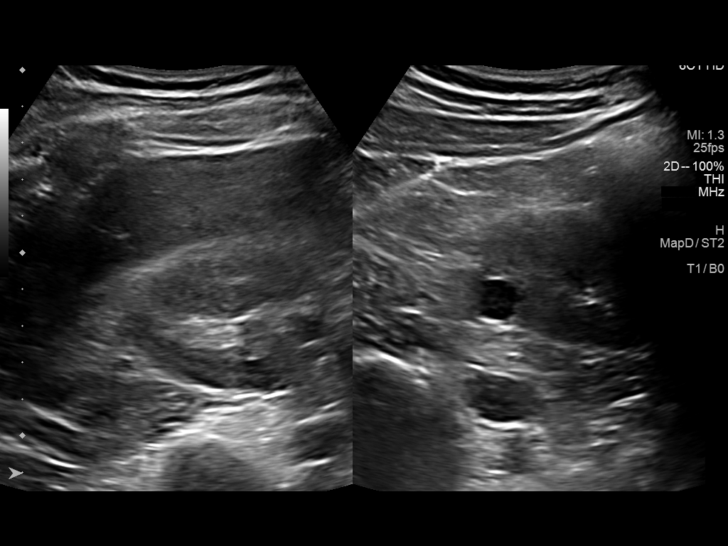
[im 26/62]
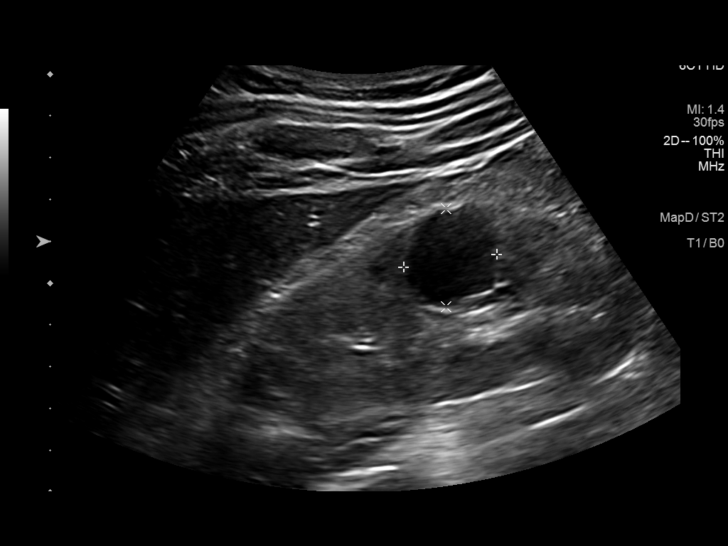
[im 31/62]
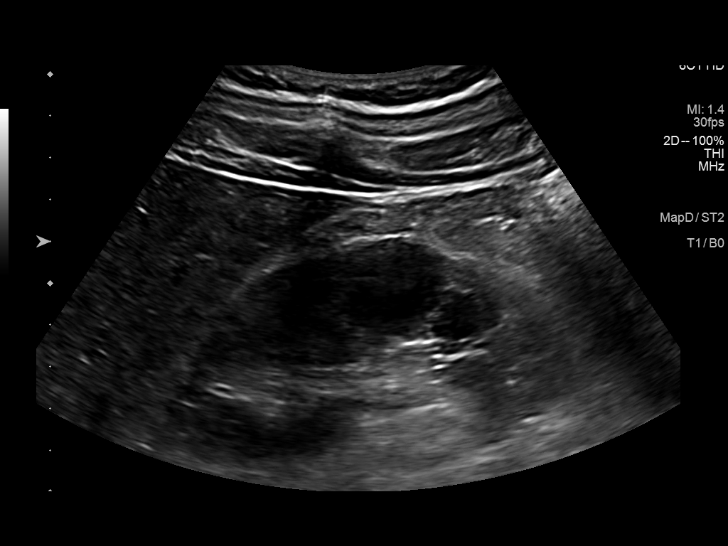
[im 36/62]
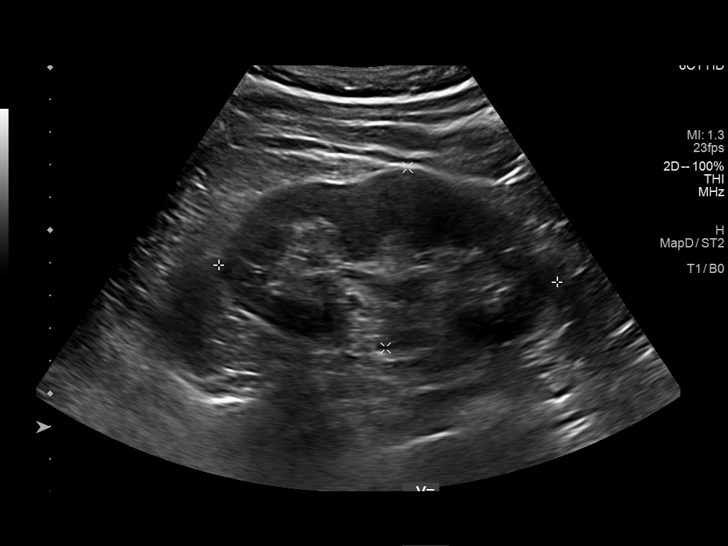
[im 41/62]
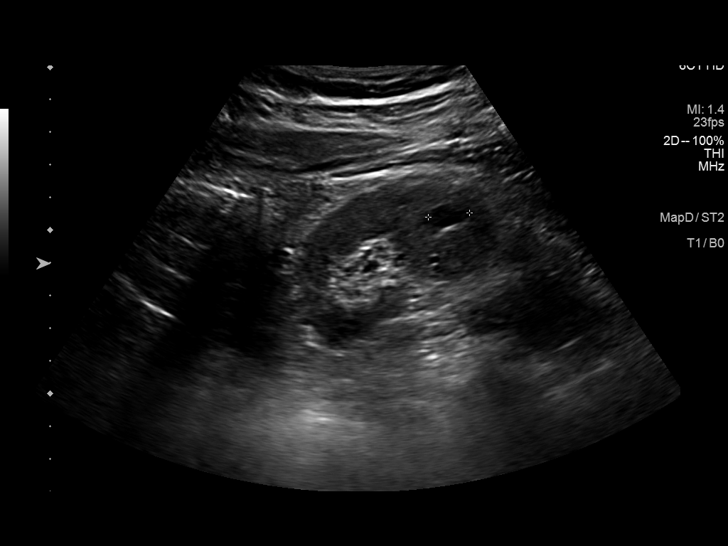
[im 46/62]
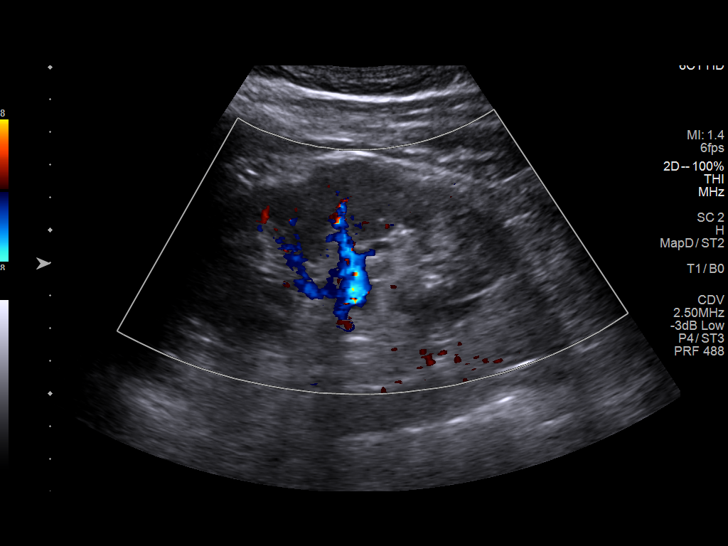
[im 51/62]
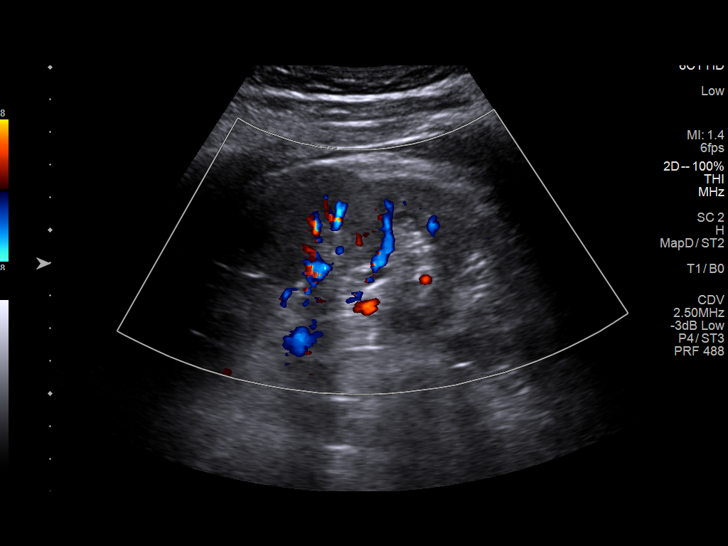
[im 56/62]
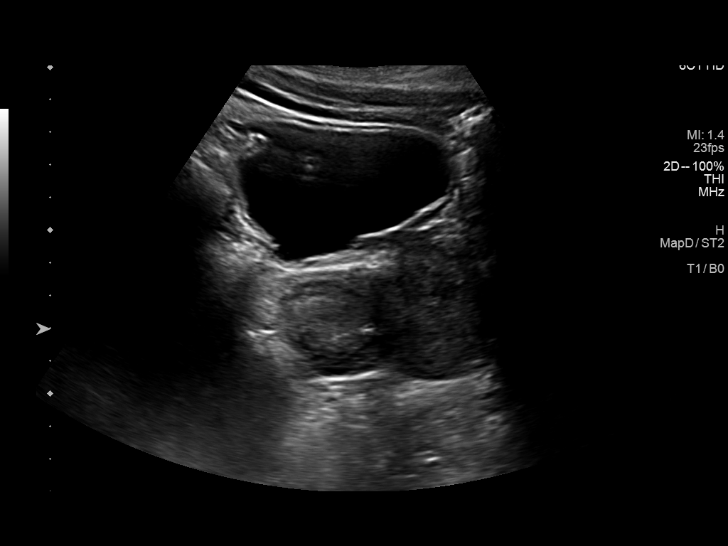
[im 62/62]
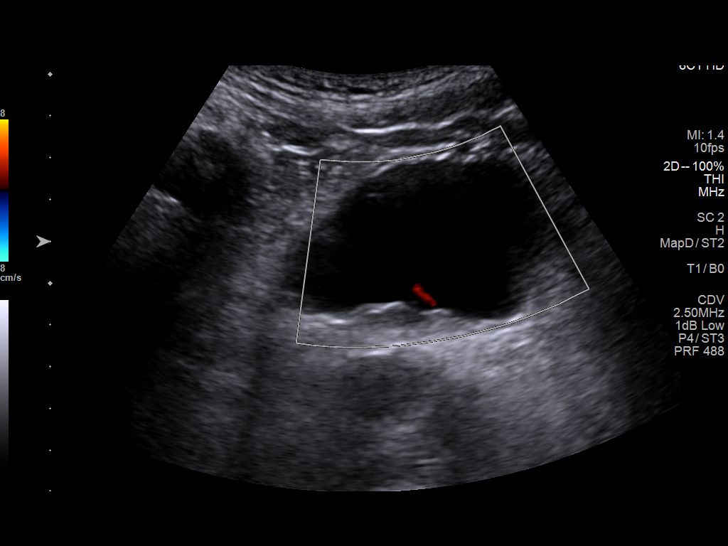

[13 of 25 positions shown; findings below may reference images not displayed]

FINDINGS: Right Kidney:

Renal measurements: 11.4 x 4.7 x 4.8 cm = volume: 136 mL .
Echogenicity and renal cortical thickness are within normal limits.
No perinephric fluid or hydronephrosis visualized. There is a cyst
arising from the mid to lower right kidney measuring 2.3 x 2.4 x
cm. There is a cyst arising in the lower pole region measuring 1.4 x
1.2 x 1.2 cm. There is minimal septation in this cyst.

Left Kidney:

Renal measurements: 10.4 x 5.6 x 4.4 cm = volume: 132 mL.
Echogenicity and renal cortical thickness are within normal limits.
No perinephric fluid or hydronephrosis visualized.

There is a cyst in the lower pole region measuring 1.1 x 0.7 x
cm. A second cyst in the midportion measures 1.6 x 1.2 x 1.6 cm. No
sonographically demonstrable calculus or ureterectasis.

Bladder:

Appears normal for degree of bladder distention.

Prostate measures 7.2 x 5.2 x 5.3 cm.
IMPRESSION: Renal cysts bilaterally. No obstructing focus in either kidney.
Renal cortical thickness and echogenicity are normal bilaterally.

Prostate enlarged. This finding may warrant clinical and PSA
assessment.

## 2020-03-05 ENCOUNTER — Other Ambulatory Visit: Payer: Self-pay

## 2020-03-05 ENCOUNTER — Other Ambulatory Visit (INDEPENDENT_AMBULATORY_CARE_PROVIDER_SITE_OTHER): Payer: 59

## 2020-03-05 DIAGNOSIS — E291 Testicular hypofunction: Secondary | ICD-10-CM | POA: Diagnosis not present

## 2020-03-05 MED ORDER — TESTOSTERONE CYPIONATE 200 MG/ML IM SOLN
200.0000 mg | Freq: Once | INTRAMUSCULAR | Status: AC
  Administered 2020-03-05: 200 mg via INTRAMUSCULAR

## 2020-03-20 ENCOUNTER — Other Ambulatory Visit: Payer: Self-pay | Admitting: Medical

## 2020-03-26 ENCOUNTER — Ambulatory Visit: Payer: 59 | Admitting: Medical

## 2020-03-26 ENCOUNTER — Other Ambulatory Visit (INDEPENDENT_AMBULATORY_CARE_PROVIDER_SITE_OTHER): Payer: 59

## 2020-03-26 ENCOUNTER — Encounter: Payer: Self-pay | Admitting: Medical

## 2020-03-26 ENCOUNTER — Other Ambulatory Visit: Payer: Self-pay

## 2020-03-26 VITALS — BP 144/100 | HR 73 | Ht 74.0 in | Wt 176.8 lb

## 2020-03-26 DIAGNOSIS — R7989 Other specified abnormal findings of blood chemistry: Secondary | ICD-10-CM | POA: Diagnosis not present

## 2020-03-26 DIAGNOSIS — N289 Disorder of kidney and ureter, unspecified: Secondary | ICD-10-CM | POA: Diagnosis not present

## 2020-03-26 DIAGNOSIS — I10 Essential (primary) hypertension: Secondary | ICD-10-CM | POA: Diagnosis not present

## 2020-03-26 DIAGNOSIS — E291 Testicular hypofunction: Secondary | ICD-10-CM | POA: Diagnosis not present

## 2020-03-26 DIAGNOSIS — R413 Other amnesia: Secondary | ICD-10-CM | POA: Diagnosis not present

## 2020-03-26 DIAGNOSIS — R42 Dizziness and giddiness: Secondary | ICD-10-CM | POA: Diagnosis not present

## 2020-03-26 MED ORDER — TESTOSTERONE CYPIONATE 200 MG/ML IM SOLN
200.0000 mg | Freq: Once | INTRAMUSCULAR | Status: AC
  Administered 2020-03-26: 200 mg via INTRAMUSCULAR

## 2020-03-26 MED ORDER — MECLIZINE HCL 25 MG PO TABS
25.0000 mg | ORAL_TABLET | Freq: Two times a day (BID) | ORAL | 0 refills | Status: DC
Start: 2020-03-26 — End: 2020-06-11

## 2020-03-26 NOTE — Patient Instructions (Signed)
Your dizziness/lightheadedness is somewhat mild.   This could be caused by numerous things  For the next week  Begin meclizine 25mg , 1 tablet twice daily for the next 5 days  This is for vertigo symptoms  Hydrate with at least 80-100 ounces of water daily  meclizine can cause drowsiness  Increase the Irbesartan by taking 2 tablets daily for the next week until you see kidney doctor  We had stopped Irbesartan HCT combo back in July due to excessive urination  Make sure you discuss this with kidney doctor  Continue Cardura blood pressure pill for now  Consider easing a mid morning snack this week given the dizziness   If new symptoms such as:  Confusion  Slurred speech  Fall  One sided weakness or numbness  Loss of vision or hearing  Then call 911   If no scary symptoms above, but no improvement, recheck for additional evaluation which could include labs, EKG, head CT

## 2020-03-26 NOTE — Progress Notes (Signed)
Subjective:  Shawn Pena is a 54 y.o. male who presents for Chief Complaint  Patient presents with  . Dizziness    every other day-last time was Saturday      Here for concern of dizziness.  Been getting random spells every other day since a week ago.  Episodes feel like things are moving or shifting.  Got lightheaded, rested against his stand at work, and shut eyes.  Lasts for few minutes.  No ringing in ear, no hearing loss. No associated nausea, on palpitations, no sweats.  Getting this typically once daily around lunch time.  One episode was in the evening recently.  Not checking BP at home.    typically eats lunch and dinner. The episodes were typically before he had eaten lunch.    Compliant with irbesartan 75 mg daily, Cardura 1 mg daily  Back in July we stopped the diuretic as he was urinating a bunch at night and during the day, and he still was not getting blood pressure to goal.  He started irbesartan without HCTZ.  We started Cardura for blood pressure and prostate.  Just started walking for exercise yesterday.  Sees nephrology next week for routine f/u.  Lately has had some problems remembering things.  Had blurred vision a little in recent days.   No weakness, no fall, no chest pain, no palpations, no confusion.  Gets hand numbness if in bed sleeping with hands above heart.    No other aggravating or relieving factors.    No other c/o.  Past Medical History:  Diagnosis Date  . Glaucoma   . Hyperlipidemia   . Hypertension   . Hypogonadism male   . Low testosterone    Current Outpatient Medications on File Prior to Visit  Medication Sig Dispense Refill  . brimonidine-timolol (COMBIGAN) 0.2-0.5 % ophthalmic solution Place 2 drops into both eyes every 12 (twelve) hours.    . clindamycin-benzoyl peroxide (BENZACLIN) gel APPLY TO AFFECTED AREA TWICE A DAY 25 g 0  . doxazosin (CARDURA) 1 MG tablet TAKE 1 TABLET BY MOUTH EVERY DAY 30 tablet 0  . irbesartan (AVAPRO) 75  MG tablet TAKE 1 TABLET BY MOUTH EVERY DAY 30 tablet 0  . pravastatin (PRAVACHOL) 20 MG tablet TAKE 1 TABLET BY MOUTH EVERY DAY IN THE EVENING 90 tablet 1  . RESTASIS 0.05 % ophthalmic emulsion 1 drop 2 (two) times daily.    . sildenafil (REVATIO) 20 MG tablet TAKE 2 TABLETS BY MOUTH ONCE DAILY AS NEEDED PRIOR  TO  SEXUAL  ACTIVITY 60 tablet 11  . testosterone cypionate (DEPOTESTOSTERONE CYPIONATE) 200 MG/ML injection Inject 1 mL (200 mg total) into the muscle every 28 (twenty-eight) days. 2 mL 5  . ZIOPTAN 0.0015 % SOLN Apply 1 drop to eye at bedtime.    Marland Kitchen ROCKLATAN 0.02-0.005 % SOLN  (Patient not taking: Reported on 03/26/2020)    . Travoprost, BAK Free, (TRAVATAN) 0.004 % SOLN ophthalmic solution  (Patient not taking: Reported on 02/13/2020)     Current Facility-Administered Medications on File Prior to Visit  Medication Dose Route Frequency Provider Last Rate Last Admin  . testosterone cypionate (DEPOTESTOSTERONE CYPIONATE) injection 200 mg  200 mg Intramuscular Q21 days Carlena Hurl, PA-C   200 mg at 02/13/20 7353  . testosterone cypionate (DEPOTESTOSTERONE CYPIONATE) injection 200 mg  200 mg Intramuscular Once Dietra Stokely, Camelia Eng, PA-C      . testosterone cypionate (DEPOTESTOSTERONE CYPIONATE) injection 200 mg  200 mg Intramuscular Q14 Days Chana Bode  S, PA-C   200 mg at 05/23/19 0855  . testosterone cypionate (DEPOTESTOSTERONE CYPIONATE) injection 200 mg  200 mg Intramuscular Q21 days Carlena Hurl, PA-C   200 mg at 08/29/19 1640  . testosterone cypionate (DEPOTESTOSTERONE CYPIONATE) injection 200 mg  200 mg Intramuscular Q14 Days Carlena Hurl, PA-C   200 mg at 09/19/19 0919  . testosterone cypionate (DEPOTESTOSTERONE CYPIONATE) injection 200 mg  200 mg Intramuscular Q14 Days Carlena Hurl, PA-C   200 mg at 10/10/19 3329  . testosterone cypionate (DEPOTESTOSTERONE CYPIONATE) injection 200 mg  200 mg Intramuscular Q14 Days Carlena Hurl, PA-C   200 mg at 12/19/19 5188     The following portions of the patient's history were reviewed and updated as appropriate: allergies, current medications, past family history, past medical history, past social history, past surgical history and problem list.  ROS Otherwise as in subjective above    Objective: BP (!) 144/100   Pulse 73   Ht 6\' 2"  (1.88 m)   Wt 176 lb 12.8 oz (80.2 kg)   SpO2 98%   BMI 22.70 kg/m   BP Readings from Last 3 Encounters:  03/26/20 (!) 144/100  02/13/20 (!) 142/88  01/23/20 (!) 158/106   Wt Readings from Last 3 Encounters:  03/26/20 176 lb 12.8 oz (80.2 kg)  02/13/20 180 lb 6.4 oz (81.8 kg)  01/23/20 174 lb 12.8 oz (79.3 kg)    General appearance: alert, no distress, well developed, well nourished HEENT: normocephalic, sclerae anicteric, conjunctiva pink and moist, TMs pearly, nares patent, no discharge or erythema, pharynx normal Oral cavity: MMM, no lesions Neck: supple, no lymphadenopathy, no thyromegaly, no masses, no bruits, no JVD Heart: RRR, normal S1, S2, no murmurs Lungs: CTA bilaterally, no wheezes, rhonchi, or rales Pulses: 2+ radial pulses, 2+ pedal pulses, normal cap refill Ext: no edema Neuro: CN2-12 intact, right eye lags a little, somewhat asymmetric but chronic, not new, otherwise PERRLA, EOMi, somewhat off balance with heel to toe, otherwise unremarkable nonfocal exam  EKG  rate 66 bpm, PR 124 ms, QRS 76 ms, QTC 410 ms, axis 49 degrees, sinus rhythm with occasional PVC    Assessment: Encounter Diagnoses  Name Primary?  . Dizziness Yes  . Essential hypertension, benign   . Memory change   . Renal insufficiency      Plan: Dizziness - discussed possible causes, benign and serious causes.   EKG reviewed.  Begin trial of meclizine, increased water intake, use relative rest.   Add morning snack as he is non fasting typically until lunch.  Increase Irbesartan to 2 tablets of the 75mg  daily.  Call report 2-3 days  HTN - not at goal.  We stopped higher  dose irbesartan HCT in July due to intolerance and excessive urination.   He will use 2 of his Irbesartan 75mg  tablets daily now until he sees nephrology next week.  C/t Cardura added 7/21.  Memory changes - discussed his concerns, likely age related, mild.   Consider formal neurology consult or head imaging.   He will consider  Renal insufficieny - f/u with nephrology next week as planned  Waris was seen today for dizziness.  Diagnoses and all orders for this visit:  Dizziness -     EKG 12-Lead  Essential hypertension, benign -     EKG 12-Lead  Memory change  Renal insufficiency  Other orders -     meclizine (ANTIVERT) 25 MG tablet; Take 1 tablet (25 mg total) by mouth 2 (  two) times daily.    Follow up: with nephrology next week as scheduled

## 2020-03-28 ENCOUNTER — Telehealth: Payer: Self-pay

## 2020-03-28 NOTE — Telephone Encounter (Signed)
Pt. Stated yes he has already increased his BP medicine as discussed. He thinks the meclizine is making him extremely tired so he is not going to take that one any more. He said he already got an EKG when he was last here so he said if you could go ahead an order a head scan. He said unless he could take 1/2 a tablet of the meclizine he wasn't going to take it any more.

## 2020-03-28 NOTE — Telephone Encounter (Signed)
Pt. Called was told to call with an update and he said he is still light headed and the new medicine you gave him is too much it is making him very sleepy.

## 2020-03-28 NOTE — Telephone Encounter (Signed)
Pt. Aware that he can take 1/2 a tab of meclizine in the evening, and I also got him scheduled to come in this Tuesday for a f/u with ekg and lab work.

## 2020-03-28 NOTE — Telephone Encounter (Signed)
He can try 1/2 tablet meclizine either in evening only, if he is seeing benefit.  If its not helping then just stop it.     I need him to come in for labs to do along with CT.   Abnormal heart rhythms can cause dizziness, which is the reason for EKG in case something new is going on.

## 2020-03-28 NOTE — Telephone Encounter (Signed)
We discussed if not much improvement to consider recheck for labs, possibly EKG and head scan.   See if he wants to move forward with these tests?  I assume he has increased the BP dose as we discussed.

## 2020-03-29 ENCOUNTER — Other Ambulatory Visit: Payer: Self-pay | Admitting: Medical

## 2020-03-29 DIAGNOSIS — I10 Essential (primary) hypertension: Secondary | ICD-10-CM

## 2020-03-29 DIAGNOSIS — R42 Dizziness and giddiness: Secondary | ICD-10-CM

## 2020-03-29 DIAGNOSIS — Z79899 Other long term (current) drug therapy: Secondary | ICD-10-CM

## 2020-03-29 NOTE — Telephone Encounter (Signed)
Ok, lab orders are in standing

## 2020-03-29 NOTE — Telephone Encounter (Signed)
I offered the pt. An apt. Today or Friday but he wanted to come in on Tuesday also told him to call back before then if he really doesn't feel good.

## 2020-03-29 NOTE — Telephone Encounter (Signed)
If he is still feeling that dizzy he should not wait till next Tuesday to come in

## 2020-04-03 ENCOUNTER — Encounter: Payer: Self-pay | Admitting: Medical

## 2020-04-03 ENCOUNTER — Other Ambulatory Visit: Payer: Self-pay

## 2020-04-03 ENCOUNTER — Ambulatory Visit (INDEPENDENT_AMBULATORY_CARE_PROVIDER_SITE_OTHER): Payer: 59 | Admitting: Medical

## 2020-04-03 VITALS — BP 142/70 | HR 90 | Ht 74.0 in | Wt 181.4 lb

## 2020-04-03 DIAGNOSIS — H6121 Impacted cerumen, right ear: Secondary | ICD-10-CM | POA: Diagnosis not present

## 2020-04-03 DIAGNOSIS — Z79899 Other long term (current) drug therapy: Secondary | ICD-10-CM

## 2020-04-03 DIAGNOSIS — I10 Essential (primary) hypertension: Secondary | ICD-10-CM | POA: Diagnosis not present

## 2020-04-03 DIAGNOSIS — R42 Dizziness and giddiness: Secondary | ICD-10-CM | POA: Diagnosis not present

## 2020-04-03 NOTE — Progress Notes (Signed)
Subjective:  Shawn Pena is a 54 y.o. male who presents for Chief Complaint  Patient presents with  . Follow-up     Here for recheck on concern of dizziness.  I saw him about a week ago for the same.  He feels like symptoms are basically unchanged no worse or better since last week.  Been getting random spells every other day since a week ago.  Episodes feel like things are moving or shifting.  Got lightheaded, rested against his stand at work, and shut eyes.  Lasts for few minutes.  No ringing in ear, no hearing loss. No associated nausea, on palpitations, no sweats.  Getting this typically once daily around lunch time.  One episode was in the evening recently.  He has had a few episodes in the morning this week  He does feel a room spinning sensation.  Symptoms can be seconds to minutes.  He is checking blood sugars and they look normal.  He eats 2-3 times per day.  No nausea no chest pain no numbness or tingling, no syncope.  Yesterday he felt dizzy lying in bed.  Not checking BP at home.    typically eats lunch and dinner. The episodes were typically before he had eaten lunch.    Compliant with irbesartan and Cardura   He did see nephrology left yesterday for routine kidney follow-up.  They did labs and he knows today's that everything looks fine.  They did ask him to start monitoring his blood pressure at home but did not modify his regimen  Back in July we stopped the diuretic as he was urinating a bunch at night and during the day, and he still was not getting blood pressure to goal.  He started irbesartan without HCTZ.  We started Cardura for blood pressure and prostate.  Last visit he commented on some memory changes but in retrospect he now thinks things are just fine  No other aggravating or relieving factors.    No other c/o.  Past Medical History:  Diagnosis Date  . Glaucoma   . Hyperlipidemia   . Hypertension   . Hypogonadism male   . Low testosterone    Current  Outpatient Medications on File Prior to Visit  Medication Sig Dispense Refill  . brimonidine-timolol (COMBIGAN) 0.2-0.5 % ophthalmic solution Place 2 drops into both eyes every 12 (twelve) hours.    . clindamycin-benzoyl peroxide (BENZACLIN) gel APPLY TO AFFECTED AREA TWICE A DAY 25 g 0  . doxazosin (CARDURA) 1 MG tablet TAKE 1 TABLET BY MOUTH EVERY DAY 30 tablet 0  . irbesartan (AVAPRO) 75 MG tablet TAKE 1 TABLET BY MOUTH EVERY DAY 30 tablet 0  . meclizine (ANTIVERT) 25 MG tablet Take 1 tablet (25 mg total) by mouth 2 (two) times daily. 30 tablet 0  . pravastatin (PRAVACHOL) 20 MG tablet TAKE 1 TABLET BY MOUTH EVERY DAY IN THE EVENING 90 tablet 1  . RESTASIS 0.05 % ophthalmic emulsion 1 drop 2 (two) times daily.    . sildenafil (REVATIO) 20 MG tablet TAKE 2 TABLETS BY MOUTH ONCE DAILY AS NEEDED PRIOR  TO  SEXUAL  ACTIVITY 60 tablet 11  . testosterone cypionate (DEPOTESTOSTERONE CYPIONATE) 200 MG/ML injection Inject 1 mL (200 mg total) into the muscle every 28 (twenty-eight) days. 2 mL 5  . ZIOPTAN 0.0015 % SOLN Apply 1 drop to eye at bedtime.    Marland Kitchen ROCKLATAN 0.02-0.005 % SOLN  (Patient not taking: Reported on 03/26/2020)    . Travoprost,  BAK Free, (TRAVATAN) 0.004 % SOLN ophthalmic solution  (Patient not taking: Reported on 02/13/2020)     Current Facility-Administered Medications on File Prior to Visit  Medication Dose Route Frequency Provider Last Rate Last Admin  . testosterone cypionate (DEPOTESTOSTERONE CYPIONATE) injection 200 mg  200 mg Intramuscular Q21 days Carlena Hurl, PA-C   200 mg at 02/13/20 5397  . testosterone cypionate (DEPOTESTOSTERONE CYPIONATE) injection 200 mg  200 mg Intramuscular Once Adri Schloss, Camelia Eng, PA-C      . testosterone cypionate (DEPOTESTOSTERONE CYPIONATE) injection 200 mg  200 mg Intramuscular Q14 Days Carlena Hurl, PA-C   200 mg at 05/23/19 0855  . testosterone cypionate (DEPOTESTOSTERONE CYPIONATE) injection 200 mg  200 mg Intramuscular Q21 days  Carlena Hurl, PA-C   200 mg at 08/29/19 1640  . testosterone cypionate (DEPOTESTOSTERONE CYPIONATE) injection 200 mg  200 mg Intramuscular Q14 Days Carlena Hurl, PA-C   200 mg at 09/19/19 0919  . testosterone cypionate (DEPOTESTOSTERONE CYPIONATE) injection 200 mg  200 mg Intramuscular Q14 Days Carlena Hurl, PA-C   200 mg at 10/10/19 6734  . testosterone cypionate (DEPOTESTOSTERONE CYPIONATE) injection 200 mg  200 mg Intramuscular Q14 Days Carlena Hurl, PA-C   200 mg at 12/19/19 1937    The following portions of the patient's history were reviewed and updated as appropriate: allergies, current medications, past family history, past medical history, past social history, past surgical history and problem list.  ROS Otherwise as in subjective above    Objective: BP (!) 142/70   Pulse 90   Ht 6\' 2"  (1.88 m)   Wt 181 lb 6.4 oz (82.3 kg)   SpO2 97%   BMI 23.29 kg/m   BP Readings from Last 3 Encounters:  04/03/20 (!) 142/70  03/26/20 (!) 144/100  02/13/20 (!) 142/88   Wt Readings from Last 3 Encounters:  04/03/20 181 lb 6.4 oz (82.3 kg)  03/26/20 176 lb 12.8 oz (80.2 kg)  02/13/20 180 lb 6.4 oz (81.8 kg)    General appearance: alert, no distress, well developed, well nourished HEENT: normocephalic, sclerae anicteric, conjunctiva pink and moist, serous fluid behind bilateral TMs, moderate cerumen right ear canal,, nares patent, no discharge or erythema, pharynx normal Oral cavity: MMM, no lesions Neck: supple, no lymphadenopathy, no thyromegaly, no masses, no bruits, no JVD Heart: RRR, normal S1, S2, no murmurs Lungs: CTA bilaterally, no wheezes, rhonchi, or rales Pulses: 2+ radial pulses, 2+ pedal pulses, normal cap refill Ext: no edema Neuro: CN2-12 intact, right eye lags a little, somewhat asymmetric but chronic, not new, otherwise PERRLA, EOMi, somewhat off balance with heel to toe, otherwise unremarkable nonfocal exam  EKG done today, reviewed, no new  findings.  Normal EKG today.  No PVCs like  last visit.   Assessment: Encounter Diagnoses  Name Primary?  . Dizziness Yes  . Vertigo   . Lightheaded   . Essential hypertension, benign   . High risk medication use   . Impacted cerumen of right ear      Plan: Dizziness - discussed possible causes, benign and serious causes.   EKG reviewed.  He had tried meclizine but it makes him too sleepy.  He will begin Claritin in the morning, samples given, Flonase in the morning, and do meclizine at night.  If not much improved by the end of this week consider head scan or physical therapy for vertigo  HTN - not at goal.  We stopped higher dose irbesartan HCT in July due  to intolerance and excessive urination.   He will use 2 of his Irbesartan 75mg  tablets daily, c/t cardura.  If not at goal within 2-3 weeks we wil need to modify regimen, possibly add beta blocker  Memory changes - discussed his concerns, likely age related, mild.   Consider formal neurology consult or head imaging.   He will consider  Renal insufficieny -we called to request lab records from nephrology  I personally used ear lavage to remove cerumen from right ear.  This was successful  Derrion was seen today for follow-up.  Diagnoses and all orders for this visit:  Dizziness -     EKG 12-Lead  Vertigo -     EKG 12-Lead  Lightheaded  Essential hypertension, benign -     EKG 12-Lead  High risk medication use  Impacted cerumen of right ear    Follow up: with call back

## 2020-04-04 ENCOUNTER — Encounter: Payer: Self-pay | Admitting: Medical

## 2020-04-09 ENCOUNTER — Other Ambulatory Visit: Payer: Self-pay | Admitting: Medical

## 2020-04-09 ENCOUNTER — Telehealth: Payer: Self-pay | Admitting: Medical

## 2020-04-09 MED ORDER — DOXAZOSIN MESYLATE 1 MG PO TABS
1.0000 mg | ORAL_TABLET | Freq: Every day | ORAL | 1 refills | Status: DC
Start: 2020-04-09 — End: 2020-05-15

## 2020-04-09 NOTE — Telephone Encounter (Signed)
Pt needs refill on Cardura, said got it filled early this month but has no refills

## 2020-04-09 NOTE — Telephone Encounter (Signed)
Sent in med

## 2020-04-10 ENCOUNTER — Telehealth: Payer: Self-pay | Admitting: Medical

## 2020-04-10 MED ORDER — IRBESARTAN 75 MG PO TABS
75.0000 mg | ORAL_TABLET | Freq: Every day | ORAL | 5 refills | Status: DC
Start: 2020-04-10 — End: 2020-04-23

## 2020-04-10 NOTE — Telephone Encounter (Signed)
Sent in to pharmacy.  

## 2020-04-10 NOTE — Telephone Encounter (Signed)
Pt called for refills of irbesartan.

## 2020-04-15 ENCOUNTER — Other Ambulatory Visit: Payer: Self-pay | Admitting: Medical

## 2020-04-23 ENCOUNTER — Other Ambulatory Visit: Payer: 59

## 2020-04-23 ENCOUNTER — Other Ambulatory Visit (INDEPENDENT_AMBULATORY_CARE_PROVIDER_SITE_OTHER): Payer: 59

## 2020-04-23 ENCOUNTER — Other Ambulatory Visit: Payer: Self-pay

## 2020-04-23 ENCOUNTER — Telehealth: Payer: Self-pay | Admitting: Medical

## 2020-04-23 ENCOUNTER — Other Ambulatory Visit: Payer: Self-pay | Admitting: Medical

## 2020-04-23 DIAGNOSIS — E291 Testicular hypofunction: Secondary | ICD-10-CM | POA: Diagnosis not present

## 2020-04-23 MED ORDER — IRBESARTAN 150 MG PO TABS: 150.0000 mg | ORAL_TABLET | Freq: Every day | ORAL | 3 refills | Status: DC

## 2020-04-23 NOTE — Telephone Encounter (Signed)
I sent the 150mg  Irbesartan to pharmacy and he should still be on Cardura.   How is the BP running at home, and did the dizziness resolve?

## 2020-04-23 NOTE — Telephone Encounter (Signed)
Spoke to patient. He has not been dizzy today and blood pressure has still been running high 140-150/ 100 when he takes it in the morning. He stated he most times take medicine after taking his blood pressure.

## 2020-04-23 NOTE — Telephone Encounter (Signed)
Pt came in for nurse visit and states that his irbesartan does was increased to 2 a day but rx at pharmacy is still written for 1 a day. Please resend rx showing new dose. Please send to [facility].

## 2020-04-24 ENCOUNTER — Other Ambulatory Visit: Payer: Self-pay | Admitting: Medical

## 2020-04-24 NOTE — Telephone Encounter (Signed)
Patient stated he has started taking blood pressure in left are and it was 121/97 last night and 124/94  this morning. He will be documenting 3 times a day. He will call us on next Monday to let us know what his reading are.

## 2020-04-24 NOTE — Telephone Encounter (Signed)
Did he say he was going to go ahead and try the taking 2 Cardura daily to go up on the dose now or wait to see what his blood pressures look like?  I just want to make sure we have this documented correctly based on what he said he was going to do

## 2020-04-24 NOTE — Telephone Encounter (Signed)
He's going to wait to see how his blood pressure is going forward.

## 2020-04-24 NOTE — Telephone Encounter (Signed)
doxazosin (CARDURA) 1 MG tablet irbesartan (AVAPRO) 150 MG tablet  Current medications for blood pressure are as above.    For a couple of days I recommend he take his blood pressure 3 times a day just to get a good variety of numbers to know where he is averaging.  If his average blood pressures are above 130/80, then I recommend we make some adjustments.  If blood pressure readings are running high as listed in prior message or greater than 130/80, then I want him to try taking 2 mg or 2 tablets of Cardura/doxazosin daily as his blood pressure is still not controlled.  Continue the irbesartan 150 mg daily  If for some reason if he gets more lightheaded or dizzy making this change with the Cardura, then let me know.   We may need to just switch out Cardura for something else  I do not want him getting dizzy, having high blood pressure readings or having problems with his medications in terms of side effects

## 2020-05-14 ENCOUNTER — Other Ambulatory Visit (INDEPENDENT_AMBULATORY_CARE_PROVIDER_SITE_OTHER): Payer: 59

## 2020-05-14 ENCOUNTER — Other Ambulatory Visit: Payer: Self-pay

## 2020-05-14 DIAGNOSIS — E291 Testicular hypofunction: Secondary | ICD-10-CM

## 2020-05-14 MED ORDER — TESTOSTERONE CYPIONATE 200 MG/ML IM SOLN: 200.0000 mg | Freq: Once | INTRAMUSCULAR | Status: DC

## 2020-05-15 ENCOUNTER — Other Ambulatory Visit: Payer: Self-pay | Admitting: Medical

## 2020-05-18 ENCOUNTER — Other Ambulatory Visit: Payer: Self-pay | Admitting: Medical

## 2020-05-18 ENCOUNTER — Telehealth: Payer: Self-pay | Admitting: Medical

## 2020-05-18 NOTE — Telephone Encounter (Signed)
Left message for pt

## 2020-05-18 NOTE — Telephone Encounter (Signed)
I got his BP readings.  Still a little too high.  Continue Irbesartan 150mg  daily.   Increase the Cardura/Doxazosin from 1mg  to 2mg  daily, 2 tablets daily.  Lets see how this does  Set up recheck in 3 weeks.

## 2020-05-22 NOTE — Telephone Encounter (Signed)
Called pt and he got my message and I scheduled appt for follow up

## 2020-06-04 ENCOUNTER — Encounter: Payer: Self-pay | Admitting: Medical

## 2020-06-04 ENCOUNTER — Other Ambulatory Visit: Payer: Self-pay

## 2020-06-04 ENCOUNTER — Ambulatory Visit (INDEPENDENT_AMBULATORY_CARE_PROVIDER_SITE_OTHER): Payer: 59 | Admitting: Medical

## 2020-06-04 ENCOUNTER — Other Ambulatory Visit: Payer: 59

## 2020-06-04 VITALS — BP 138/84 | HR 84 | Ht 74.0 in | Wt 193.2 lb

## 2020-06-04 DIAGNOSIS — R42 Dizziness and giddiness: Secondary | ICD-10-CM

## 2020-06-04 DIAGNOSIS — R7989 Other specified abnormal findings of blood chemistry: Secondary | ICD-10-CM | POA: Diagnosis not present

## 2020-06-04 DIAGNOSIS — Z79899 Other long term (current) drug therapy: Secondary | ICD-10-CM

## 2020-06-04 DIAGNOSIS — Z23 Encounter for immunization: Secondary | ICD-10-CM | POA: Diagnosis not present

## 2020-06-04 DIAGNOSIS — E291 Testicular hypofunction: Secondary | ICD-10-CM

## 2020-06-04 DIAGNOSIS — N189 Chronic kidney disease, unspecified: Secondary | ICD-10-CM | POA: Insufficient documentation

## 2020-06-04 DIAGNOSIS — I10 Essential (primary) hypertension: Secondary | ICD-10-CM

## 2020-06-04 DIAGNOSIS — E785 Hyperlipidemia, unspecified: Secondary | ICD-10-CM

## 2020-06-04 DIAGNOSIS — R635 Abnormal weight gain: Secondary | ICD-10-CM | POA: Insufficient documentation

## 2020-06-04 DIAGNOSIS — N529 Male erectile dysfunction, unspecified: Secondary | ICD-10-CM

## 2020-06-04 MED ORDER — TESTOSTERONE CYPIONATE 200 MG/ML IM SOLN: 200.0000 mg | INTRAMUSCULAR | Status: DC

## 2020-06-04 NOTE — Progress Notes (Signed)
Subjective:  Shawn Pena is a 54 y.o. male who presents for Chief Complaint  Patient presents with  . Blood Pressure Check     Here for follow-up on several concerns  Hypertension compliant with Cardura 2 tablets daily as well as irbesartan 150 mg daily  Hyperlipidemia-compliant with statin without complaint.  Prior use of hydrochlorothiazide.  No prior use of beta-blocker or amlodipine  ED, low testosterone-doing fine on current testosterone therapy, monthly injections.  Dizziness has improved since using over-the-counter non drowsy Dramamine.  He denies any leg swelling  He attributes the recent weight gain to some excess calories, has noticed some belly shape changes   No other aggravating or relieving factors.    No other c/o.  The following portions of the patient's history were reviewed and updated as appropriate: allergies, current medications, past family history, past medical history, past social history, past surgical history and problem list.  ROS Otherwise as in subjective above  Objective: BP 138/84   Pulse 84   Ht 6\' 2"  (1.88 m)   Wt 193 lb 3.2 oz (87.6 kg)   SpO2 95%   BMI 24.81 kg/m   General appearance: alert, no distress, well developed, well nourished Neck: supple, no lymphadenopathy, no thyromegaly, no masses, no JVD Heart: RRR, normal S1, S2, no murmurs Lungs: CTA bilaterally, no wheezes, rhonchi, or rales Abdomen: +bs, soft, fullness of the lower abdomen, non tender, non distended, no masses, no hepatomegaly, no splenomegaly Pulses: 2+ radial pulses, 2+ pedal pulses, normal cap refill Ext: no edema   Assessment: Encounter Diagnoses  Name Primary?  . Essential hypertension, benign Yes  . Need for vaccination   . Low testosterone   . Hypogonadism in male   . Erectile dysfunction, unspecified erectile dysfunction type   . High risk medication use   . Hyperlipidemia, unspecified hyperlipidemia type   . Need for influenza vaccination   .  Dizziness   . Weight gain   . Chronic kidney disease, unspecified CKD stage      Plan: Hypertension-I reviewed his home readings in current readings here.  Continue irbesartan 150 mg daily.  Continue Cardura 2 mg daily, continue healthy lifestyle  Hyperlipidemia-continue statin, reviewed recent labs in the chart record  Low testosterone, hypogonadism-continue testosterone therapy monthly.  Reviewed labs in the chart record.    Erectile dysfunction-discussed symptoms and concerns, possible use of medications.  Counseled on [employer] Covid virus vaccine.  Vaccine information sheet given.  Covid vaccine given after consent obtained.  Counseled on the influenza virus vaccine.  Vaccine information sheet given.  Influenza vaccine given after consent obtained.  CKD - reviewed recent nephrology notes.  Avoid NSAIDs. Continue current therapy   Kennie was seen today for blood pressure check.  Diagnoses and all orders for this visit:  Essential hypertension, benign  Need for vaccination -     Pfizer SARS-COV-2 Vaccine  Low testosterone -     testosterone cypionate (DEPOTESTOSTERONE CYPIONATE) injection 200 mg  Hypogonadism in male  Erectile dysfunction, unspecified erectile dysfunction type  High risk medication use  Hyperlipidemia, unspecified hyperlipidemia type  Need for influenza vaccination  Dizziness  Weight gain  Chronic kidney disease, unspecified CKD stage  Other orders -     Flu Vaccine QUAD 6+ mos PF IM (Fluarix Quad PF)    Follow up: 3 weeks for TST injection, weight and BP check

## 2020-06-10 ENCOUNTER — Other Ambulatory Visit: Payer: Self-pay | Admitting: Medical

## 2020-06-11 ENCOUNTER — Other Ambulatory Visit: Payer: Self-pay | Admitting: Medical

## 2020-06-11 ENCOUNTER — Telehealth: Payer: Self-pay | Admitting: Medical

## 2020-06-11 MED ORDER — PRAVASTATIN SODIUM 20 MG PO TABS: ORAL_TABLET | ORAL | 1 refills | Status: DC

## 2020-06-11 MED ORDER — DOXAZOSIN MESYLATE 2 MG PO TABS: 2.0000 mg | ORAL_TABLET | Freq: Every day | ORAL | 1 refills | Status: DC

## 2020-06-11 NOTE — Telephone Encounter (Signed)
Pt advised.

## 2020-06-11 NOTE — Telephone Encounter (Signed)
I sent the correct dose of Cardura 2 mg.  He has been taking 1 mg, 2 tablets daily.  So this is the new dose.  I also sent Pravachol.  He needs to remind his pharmacy to send this refill request as I have not gotten any refill request from his pharmacy on the Pravachol.

## 2020-06-11 NOTE — Telephone Encounter (Signed)
Pt stopped by and needs refill on Doxazosin and [facility]atin [facility] [address]

## 2020-06-25 ENCOUNTER — Other Ambulatory Visit: Payer: Self-pay

## 2020-06-25 ENCOUNTER — Other Ambulatory Visit: Payer: 59

## 2020-06-25 ENCOUNTER — Telehealth: Payer: Self-pay

## 2020-06-25 VITALS — BP 144/92 | Wt 192.4 lb

## 2020-06-25 DIAGNOSIS — E291 Testicular hypofunction: Secondary | ICD-10-CM

## 2020-06-25 MED ORDER — TESTOSTERONE CYPIONATE 200 MG/ML IM SOLN: 200.0000 mg | Freq: Once | INTRAMUSCULAR | Status: DC

## 2020-06-25 NOTE — Telephone Encounter (Signed)
Pt weight and B/P was obtained per your request.  Weight was 192.4lb and b/ p reading was 144/92. Please advise . Thanks   [employer]

## 2020-06-26 ENCOUNTER — Other Ambulatory Visit: Payer: Self-pay | Admitting: Medical

## 2020-06-26 MED ORDER — AMLODIPINE BESYLATE 10 MG PO TABS: 10.0000 mg | ORAL_TABLET | Freq: Every day | ORAL | 2 refills | Status: DC

## 2020-06-26 NOTE — Telephone Encounter (Signed)
Continue Irbesartan 150mg  daily  I want to wean off Cardura as its not doing as well as I think it should for bp and may be causing some of the dizziness.  Lets have him take 1/2 tablet of the 2mg  daily for a week, then 1/2 tablet every other day for a week then stop it.  I want to BEGIN Amlodipine 10mg , but start 1/2 tablet daily.   He can initiate this new medication as he is also weaning off the Cardura.    I think this will be a better option for his BP and lets see if the dizziness will go away.  Follow up with call back in 3-4 weeks

## 2020-06-26 NOTE — Telephone Encounter (Signed)
Patient informed. 

## 2020-07-04 ENCOUNTER — Other Ambulatory Visit: Payer: Self-pay | Admitting: Medical

## 2020-07-16 ENCOUNTER — Other Ambulatory Visit: Payer: Self-pay

## 2020-07-16 ENCOUNTER — Other Ambulatory Visit (INDEPENDENT_AMBULATORY_CARE_PROVIDER_SITE_OTHER): Payer: 59

## 2020-07-16 DIAGNOSIS — E291 Testicular hypofunction: Secondary | ICD-10-CM

## 2020-07-16 MED ORDER — TESTOSTERONE CYPIONATE 200 MG/ML IM SOLN
200.0000 mg | INTRAMUSCULAR | Status: DC
  Administered 2020-07-16 – 2020-08-06 (×2): 200 mg via INTRAMUSCULAR

## 2020-08-06 ENCOUNTER — Other Ambulatory Visit: Payer: Self-pay

## 2020-08-06 ENCOUNTER — Other Ambulatory Visit (INDEPENDENT_AMBULATORY_CARE_PROVIDER_SITE_OTHER): Payer: 59

## 2020-08-06 DIAGNOSIS — E291 Testicular hypofunction: Secondary | ICD-10-CM

## 2020-08-27 ENCOUNTER — Other Ambulatory Visit: Payer: Self-pay

## 2020-08-27 ENCOUNTER — Other Ambulatory Visit (INDEPENDENT_AMBULATORY_CARE_PROVIDER_SITE_OTHER): Payer: 59

## 2020-08-27 DIAGNOSIS — R7989 Other specified abnormal findings of blood chemistry: Secondary | ICD-10-CM | POA: Diagnosis not present

## 2020-08-27 MED ORDER — TESTOSTERONE CYPIONATE 200 MG/ML IM SOLN
200.0000 mg | Freq: Once | INTRAMUSCULAR | Status: AC
  Administered 2020-08-27: 200 mg via INTRAMUSCULAR

## 2020-09-14 ENCOUNTER — Other Ambulatory Visit: Payer: Self-pay | Admitting: Medical

## 2020-09-17 ENCOUNTER — Other Ambulatory Visit: Payer: Self-pay

## 2020-09-17 ENCOUNTER — Other Ambulatory Visit (INDEPENDENT_AMBULATORY_CARE_PROVIDER_SITE_OTHER): Payer: 59

## 2020-09-17 DIAGNOSIS — R7989 Other specified abnormal findings of blood chemistry: Secondary | ICD-10-CM

## 2020-09-17 DIAGNOSIS — E291 Testicular hypofunction: Secondary | ICD-10-CM

## 2020-09-17 MED ORDER — TESTOSTERONE CYPIONATE 200 MG/ML IM SOLN
200.0000 mg | Freq: Once | INTRAMUSCULAR | Status: AC
  Administered 2020-10-08: 200 mg via INTRAMUSCULAR

## 2020-09-21 ENCOUNTER — Other Ambulatory Visit: Payer: Self-pay | Admitting: Medical

## 2020-10-08 ENCOUNTER — Other Ambulatory Visit (INDEPENDENT_AMBULATORY_CARE_PROVIDER_SITE_OTHER): Payer: 59

## 2020-10-08 ENCOUNTER — Other Ambulatory Visit: Payer: 59

## 2020-10-08 ENCOUNTER — Other Ambulatory Visit: Payer: Self-pay

## 2020-10-08 DIAGNOSIS — E291 Testicular hypofunction: Secondary | ICD-10-CM | POA: Diagnosis not present

## 2020-10-08 DIAGNOSIS — R7989 Other specified abnormal findings of blood chemistry: Secondary | ICD-10-CM | POA: Diagnosis not present

## 2020-10-08 MED ORDER — TESTOSTERONE CYPIONATE 200 MG/ML IM SOLN: 200.0000 mg | Freq: Once | INTRAMUSCULAR | Status: DC

## 2020-10-16 ENCOUNTER — Other Ambulatory Visit: Payer: Self-pay | Admitting: Medical

## 2020-10-29 ENCOUNTER — Other Ambulatory Visit (INDEPENDENT_AMBULATORY_CARE_PROVIDER_SITE_OTHER): Payer: 59

## 2020-10-29 DIAGNOSIS — R7989 Other specified abnormal findings of blood chemistry: Secondary | ICD-10-CM | POA: Diagnosis not present

## 2020-10-29 DIAGNOSIS — E291 Testicular hypofunction: Secondary | ICD-10-CM | POA: Diagnosis not present

## 2020-10-29 MED ORDER — TESTOSTERONE CYPIONATE 200 MG/ML IM SOLN
200.0000 mg | Freq: Once | INTRAMUSCULAR | Status: AC
  Administered 2020-10-29: 200 mg via INTRAMUSCULAR

## 2020-11-13 ENCOUNTER — Other Ambulatory Visit: Payer: Self-pay | Admitting: Medical

## 2020-11-18 ENCOUNTER — Other Ambulatory Visit: Payer: Self-pay | Admitting: Medical

## 2020-11-19 ENCOUNTER — Other Ambulatory Visit: Payer: Self-pay

## 2020-11-19 ENCOUNTER — Other Ambulatory Visit: Payer: 59

## 2020-11-19 ENCOUNTER — Ambulatory Visit (INDEPENDENT_AMBULATORY_CARE_PROVIDER_SITE_OTHER): Payer: 59 | Admitting: Medical

## 2020-11-19 ENCOUNTER — Encounter: Payer: Self-pay | Admitting: Medical

## 2020-11-19 VITALS — BP 138/80 | HR 92 | Ht 74.0 in | Wt 190.6 lb

## 2020-11-19 DIAGNOSIS — I10 Essential (primary) hypertension: Secondary | ICD-10-CM | POA: Diagnosis not present

## 2020-11-19 DIAGNOSIS — M25473 Effusion, unspecified ankle: Secondary | ICD-10-CM

## 2020-11-19 DIAGNOSIS — R7989 Other specified abnormal findings of blood chemistry: Secondary | ICD-10-CM | POA: Diagnosis not present

## 2020-11-19 DIAGNOSIS — N489 Disorder of penis, unspecified: Secondary | ICD-10-CM

## 2020-11-19 DIAGNOSIS — R21 Rash and other nonspecific skin eruption: Secondary | ICD-10-CM

## 2020-11-19 MED ORDER — TESTOSTERONE CYPIONATE 200 MG/ML IM SOLN
200.0000 mg | Freq: Once | INTRAMUSCULAR | Status: AC
  Administered 2020-11-19: 200 mg via INTRAMUSCULAR

## 2020-11-19 MED ORDER — VALACYCLOVIR HCL 1 G PO TABS: ORAL_TABLET | ORAL | 0 refills | Status: DC

## 2020-11-19 NOTE — Progress Notes (Signed)
Subjective:  Shawn Pena is a 55 y.o. male who presents for Chief Complaint  Patient presents with  . Joint Swelling    Bilateral swelling      Here for leg swelling.  In last few weeks has had some swelling in both ankles, not lower legs.  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  No recent change in diet or salt intake.  No pain or swelling in calves, no chest pain, no SOB, no dyspnea.  No fever.  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  Not exercising as much as he should recently.  He did recently start wearing some over-the-counter compression hose.    He notes a viral flareup on his penis.  He has had this 1 time before since being with his current wife of 9 years.  He is curious about this being genital herpes.  Would like medication for it.  Started a few days ago and now has a couple little blisters.  Not particularly painful.  No penile discharge.  No other aggravating or relieving factors.    No other c/o.  Past Medical History:  Diagnosis Date  . Glaucoma   . Hyperlipidemia   . Hypertension   . Hypogonadism male   . Low testosterone    Current Outpatient Medications on File Prior to Visit  Medication Sig Dispense Refill  . amLODipine (NORVASC) 10 MG tablet TAKE 1 TABLET BY MOUTH EVERY DAY 30 tablet 0  . brimonidine-timolol (COMBIGAN) 0.2-0.5 % ophthalmic solution Place 2 drops into both eyes every 12 (twelve) hours.    . clindamycin-benzoyl peroxide (BENZACLIN) gel APPLY TO AFFECTED AREA TWICE A DAY 25 g 0  . irbesartan (AVAPRO) 150 MG tablet Take 1 tablet (150 mg total) by mouth daily. 90 tablet 3  . meclizine (ANTIVERT) 25 MG tablet TAKE 1 TABLET BY MOUTH TWICE A DAY 30 tablet 0  . pravastatin (PRAVACHOL) 20 MG tablet TAKE 1 TABLET BY MOUTH EVERY DAY IN THE EVENING 90 tablet 1  . RESTASIS 0.05 % ophthalmic emulsion 1 drop 2 (two) times daily.    . sildenafil (REVATIO) 20 MG tablet TAKE 2 TABLETS BY MOUTH ONCE DAILY AS NEEDED PRIOR  TO  SEXUAL  ACTIVITY 60  tablet 11  . testosterone cypionate (DEPOTESTOSTERONE CYPIONATE) 200 MG/ML injection INJECT 1 ML (200 MG TOTAL) INTO THE MUSCLE EVERY 28 (TWENTY-EIGHT) DAYS. 2 mL 0  . ZIOPTAN 0.0015 % SOLN Apply 1 drop to eye at bedtime.    Marland Kitchen ROCKLATAN 0.02-0.005 % SOLN  (Patient not taking: No sig reported)    . Travoprost, BAK Free, (TRAVATAN) 0.004 % SOLN ophthalmic solution  (Patient not taking: No sig reported)     Current Facility-Administered Medications on File Prior to Visit  Medication Dose Route Frequency Provider Last Rate Last Admin  . testosterone cypionate (DEPOTESTOSTERONE CYPIONATE) injection 200 mg  200 mg Intramuscular Once Devaughn Savant, Camelia Eng, PA-C        The following portions of the patient's history were reviewed and updated as appropriate: allergies, current medications, past family history, past medical history, past social history, past surgical history and problem list.  ROS Otherwise as in subjective above  Objective: BP 138/80   Pulse 92   Ht 6\' 2"  (1.88 m)   Wt 190 lb 9.6 oz (86.5 kg)   SpO2 95%   BMI 24.47 kg/m   BP Readings from Last 3 Encounters:  11/19/20 138/80  06/25/20 (!) 144/92  06/04/20 138/84  General appearance: alert, no distress, well developed, well nourished Neck: supple, no lymphadenopathy, no thyromegaly, no masses, no JVD Heart: RRR, normal S1, S2, no murmurs Lungs: CTA bilaterally, no wheezes, rhonchi, or rales Abdomen: +bs, soft, non tender, non distended, no masses, no hepatomegaly, no splenomegaly, no bruits Pulses: 2+ radial pulses, 1-2+ pedal pulses, normal cap refill Ext: no appreciable lower extremity edema, no calf swelling no calf tenderness negative Homans Left dorsal penis shaft with 3 vesicular lesions in a close proximity suggestive of genital herpes.    Assessment: Encounter Diagnoses  Name Primary?  . Penile lesion Yes  . Rash   . Swelling of ankle, unspecified laterality   . 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   Continue OTC compression hose, work on getting more exercise/walking, discussed elevating leg, limit salt.   He doesn't want to modify medications today.  Will defer to nephrology.  He will be seeing nephrology within the next month.  HTN - c/t current therapy and f/u with nephrology soon as planned.  Advise he start checking his blood pressure readings at least 3 times per week and write these down and take with him to his upcoming appointment with nephrology.  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  Begin valtrex, culture and labs as below.   Discussed propose use of Valtrex, goals of therapy, prevention and hygiene    Shawn Pena was seen today for joint swelling.  Diagnoses and all orders for this visit:  Penile lesion -     HIV Antibody (routine testing w rflx) -     RPR -     GC/Chlamydia Probe Amp -     Herpes simplex virus culture -     HSV(herpes simplex vrs) 1+2 ab-IgM  Rash -     HIV Antibody (routine testing w rflx) -     RPR -     GC/Chlamydia Probe Amp -     Herpes simplex virus culture -     HSV(herpes simplex vrs) 1+2 ab-IgM  Swelling of ankle, unspecified laterality  Essential hypertension, benign    Follow up: pending labs

## 2020-11-20 LAB — GC/CHLAMYDIA PROBE AMP
Chlamydia trachomatis, NAA: NEGATIVE
Neisseria Gonorrhoeae by PCR: NEGATIVE

## 2020-11-21 LAB — HSV(HERPES SIMPLEX VRS) I + II AB-IGM: HSVI/II Comb IgM: 1.32 Ratio — ABNORMAL HIGH (ref 0.00–0.90)

## 2020-11-21 LAB — RPR: RPR Ser Ql: NONREACTIVE

## 2020-11-21 LAB — HERPES SIMPLEX VIRUS CULTURE

## 2020-11-21 LAB — HIV ANTIBODY (ROUTINE TESTING W REFLEX): HIV Screen 4th Generation wRfx: NONREACTIVE

## 2020-12-03 ENCOUNTER — Encounter: Payer: Self-pay | Admitting: Medical

## 2020-12-03 ENCOUNTER — Telehealth: Payer: 59 | Admitting: Medical

## 2020-12-03 VITALS — Ht 74.0 in | Wt 190.0 lb

## 2020-12-03 DIAGNOSIS — J988 Other specified respiratory disorders: Secondary | ICD-10-CM | POA: Diagnosis not present

## 2020-12-03 DIAGNOSIS — R059 Cough, unspecified: Secondary | ICD-10-CM

## 2020-12-03 MED ORDER — ALBUTEROL SULFATE HFA 108 (90 BASE) MCG/ACT IN AERS: 2.0000 | INHALATION_SPRAY | Freq: Four times a day (QID) | RESPIRATORY_TRACT | 0 refills | Status: DC | PRN

## 2020-12-03 NOTE — Progress Notes (Signed)
Subjective:     Patient ID: Shawn Pena, male   DOB: [birth year], 55 y.o.   MRN: [id number]  This visit type was conducted due to national recommendations for restrictions regarding the COVID-19 Pandemic (e.g. social distancing) in an effort to limit this patient's exposure and mitigate transmission in our community.  Due to their co-morbid illnesses, this patient is at least at moderate risk for complications without adequate follow up.  This format is felt to be most appropriate for this patient at this time.    Documentation for virtual audio and video telecommunications through [facility] encounter:  The patient was located at home. The provider was located in the office. The patient did consent to this visit and is aware of possible charges through their insurance for this visit.  The other persons participating in this telemedicine service were none. Time spent on call was 20 minutes and in review of previous records 20 minutes total.  This virtual service is not related to other E/M service within previous 7 days.   HPI Chief Complaint  Patient presents with  . Wheezing    Wheezing, cough and sob symptoms stated last Monday    He notes about 1 week history of stuffiness, head congestion, cough, productive of phlegm, mucous build up in chest, wheezing noise in chest.  Was out of work last Tuesday, [place], and so far today.  No abdominal discomfort, no loss of taste or smell.  No fever, no sore throat.  No NVD this week but had some nausea and vomiting one day last week. + contacts with grandchild had a cold.  Using some Theraflu and mucous relief.  Has not had a covid test.  Last week was rough, so is seeing improvement.  No other aggravating or relieving factors. No other complaint.   Past Medical History:  Diagnosis Date  . Glaucoma   . Hyperlipidemia   . Hypertension   . Hypogonadism male   . Low testosterone    Current Outpatient Medications on File Prior to Visit   Medication Sig Dispense Refill  . amLODipine (NORVASC) 10 MG tablet TAKE 1 TABLET BY MOUTH EVERY DAY 30 tablet 0  . brimonidine-timolol (COMBIGAN) 0.2-0.5 % ophthalmic solution Place 2 drops into both eyes every 12 (twelve) hours.    . clindamycin-benzoyl peroxide (BENZACLIN) gel APPLY TO AFFECTED AREA TWICE A DAY 25 g 0  . irbesartan (AVAPRO) 150 MG tablet Take 1 tablet (150 mg total) by mouth daily. 90 tablet 3  . meclizine (ANTIVERT) 25 MG tablet TAKE 1 TABLET BY MOUTH TWICE A DAY 30 tablet 0  . pravastatin (PRAVACHOL) 20 MG tablet TAKE 1 TABLET BY MOUTH EVERY DAY IN THE EVENING 90 tablet 1  . RESTASIS 0.05 % ophthalmic emulsion 1 drop 2 (two) times daily.    . sildenafil (REVATIO) 20 MG tablet TAKE 2 TABLETS BY MOUTH ONCE DAILY AS NEEDED PRIOR  TO  SEXUAL  ACTIVITY 60 tablet 11  . testosterone cypionate (DEPOTESTOSTERONE CYPIONATE) 200 MG/ML injection INJECT 1 ML (200 MG TOTAL) INTO THE MUSCLE EVERY 28 (TWENTY-EIGHT) DAYS. 2 mL 0  . valACYclovir (VALTREX) 1000 MG tablet 2 tablets BID x 2 days for flare up 20 tablet 0  . ZIOPTAN 0.0015 % SOLN Apply 1 drop to eye at bedtime.    Marland Kitchen ROCKLATAN 0.02-0.005 % SOLN  (Patient not taking: No sig reported)    . Travoprost, BAK Free, (TRAVATAN) 0.004 % SOLN ophthalmic solution  (Patient not taking: No sig reported)  Current Facility-Administered Medications on File Prior to Visit  Medication Dose Route Frequency Provider Last Rate Last Admin  . testosterone cypionate (DEPOTESTOSTERONE CYPIONATE) injection 200 mg  200 mg Intramuscular Once Leva Baine, Camelia Eng, PA-C        Review of Systems As in subjective    Objective:   Physical Exam Due to coronavirus pandemic stay at home measures, patient visit was virtual and they were not examined in person.   Ht 6\' 2"  (1.88 m)   Wt 190 lb (86.2 kg)   BMI 24.39 kg/m   General: No acute distress No obvious wheezing, no labored breathing      Assessment:     Encounter Diagnoses  Name Primary?  .  Cough Yes  . Respiratory tract infection        Plan:     We discussed symptoms and concerns.  We discussed limitations of virtual consult.  He does feel like he is making progress in improving.  We discussed that I cannot completely rule out pneumonia or COVID infection.  He declines COVID test today.  Declines chest x-ray today.  He will continue Mucus Relief over-the-counter, increase hydration, begin albuterol.  Discussed risk and benefits and proper use of albuterol medication.  I advise if not much improvement over the next 48 hours to call back and we will either add Z-Pak antibiotic and/or pursue chest x-ray  Alwin was seen today for wheezing.  Diagnoses and all orders for this visit:  Cough  Respiratory tract infection  Other orders -     albuterol (VENTOLIN HFA) 108 (90 Base) MCG/ACT inhaler; Inhale 2 puffs into the lungs every 6 (six) hours as needed for wheezing or shortness of breath.  Follow-up as needed

## 2020-12-06 ENCOUNTER — Telehealth: Payer: Self-pay | Admitting: Medical

## 2020-12-06 ENCOUNTER — Other Ambulatory Visit: Payer: Self-pay | Admitting: Medical

## 2020-12-06 MED ORDER — AZITHROMYCIN 250 MG PO TABS: ORAL_TABLET | ORAL | 0 refills | Status: AC

## 2020-12-06 MED ORDER — PROMETHAZINE-DM 6.25-15 MG/5ML PO SYRP: 5.0000 mL | ORAL_SOLUTION | Freq: Four times a day (QID) | ORAL | 0 refills | Status: DC | PRN

## 2020-12-06 NOTE — Telephone Encounter (Signed)
I sent Z-Pak antibiotic and Promethazine DM cough syrup to use to help with symptoms assuming not much improvement but no significant worsening.  Is he much worse or just not much improved?  If dehydrated or extremely short of breath and should probably go to the emergency department

## 2020-12-06 NOTE — Telephone Encounter (Signed)
Pt called and states he is not much better at all. Please advise pt at [phone]. [facility].

## 2020-12-06 NOTE — Telephone Encounter (Signed)
Left detail message on machine for patient.

## 2020-12-11 ENCOUNTER — Other Ambulatory Visit: Payer: Self-pay | Admitting: Medical

## 2020-12-16 ENCOUNTER — Other Ambulatory Visit: Payer: Self-pay | Admitting: Medical

## 2020-12-17 ENCOUNTER — Encounter: Payer: Self-pay | Admitting: Medical

## 2020-12-17 ENCOUNTER — Ambulatory Visit (INDEPENDENT_AMBULATORY_CARE_PROVIDER_SITE_OTHER): Payer: 59 | Admitting: Medical

## 2020-12-17 ENCOUNTER — Other Ambulatory Visit: Payer: Self-pay

## 2020-12-17 ENCOUNTER — Ambulatory Visit
Admission: RE | Admit: 2020-12-17 | Discharge: 2020-12-17 | Disposition: A | Discharge status: Home / Self Care | Payer: PRIVATE HEALTH INSURANCE | Source: Ambulatory Visit | Attending: Medical | Admitting: Medical

## 2020-12-17 ENCOUNTER — Other Ambulatory Visit: Payer: 59

## 2020-12-17 VITALS — BP 168/80 | HR 95 | Temp 98.7°F | Ht 74.0 in | Wt 177.6 lb

## 2020-12-17 DIAGNOSIS — R7989 Other specified abnormal findings of blood chemistry: Secondary | ICD-10-CM | POA: Diagnosis not present

## 2020-12-17 DIAGNOSIS — R0600 Dyspnea, unspecified: Secondary | ICD-10-CM

## 2020-12-17 DIAGNOSIS — R059 Cough, unspecified: Secondary | ICD-10-CM

## 2020-12-17 DIAGNOSIS — R062 Wheezing: Secondary | ICD-10-CM | POA: Diagnosis not present

## 2020-12-17 DIAGNOSIS — Z87891 Personal history of nicotine dependence: Secondary | ICD-10-CM | POA: Diagnosis not present

## 2020-12-17 MED ORDER — TESTOSTERONE CYPIONATE 200 MG/ML IM SOLN
200.0000 mg | Freq: Once | INTRAMUSCULAR | Status: AC
  Administered 2020-12-17: 200 mg via INTRAMUSCULAR

## 2020-12-17 MED ORDER — TRELEGY ELLIPTA 100-62.5-25 MCG/INH IN AEPB: 1.0000 | INHALATION_SPRAY | Freq: Every day | RESPIRATORY_TRACT | 0 refills | Status: DC

## 2020-12-17 NOTE — Progress Notes (Signed)
Subjective:  Shawn Pena is a 55 y.o. male who presents for Chief Complaint  Patient presents with  . Follow-up    Lingering symptoms from covid.      Here for cough, lingering symptoms.  We did a virtual consult May 23 for respiratory tract infection.  The assumption was that he had COVID given the symptoms which were flu like and respiratory although he did not get tested.  He had exposure at that time.  During that period of time he ended up using the prescriptions including azithromycin, Promethazine DM and albuterol.  All of those helped.  However he still has lingering cough, worse at night, some wheezing, some tightness in the chest.  No prior history of using inhaler and no history of asthma.  He is a former smoker but quit smoking 20 years ago.  Most of his symptoms from the respiratory tract improved except for the cough and wheezing.  He no longer has fever, body aches or chills, no nausea or vomiting, no change in taste or smell.  He had lots of mucus coming out of his nose during the time he had the respiratory tract infection.  He still has some residual drainage but not like he had.  He is still using combination of DayQuil, NyQuil and Mucinex.  No other aggravating or relieving factors. No other complaint.  No other c/o.  The following portions of the patient's history were reviewed and updated as appropriate: allergies, current medications, past family history, past medical history, past social history, past surgical history and problem list.  ROS Otherwise as in subjective above    Objective: BP (!) 168/80   Pulse 95   Temp 98.7 F (37.1 C)   Ht 6\' 2"  (1.88 m)   Wt 177 lb 9.6 oz (80.6 kg)   SpO2 98%   BMI 22.80 kg/m   General appearance: alert, no distress, well developed, well nourished HEENT: normocephalic, sclerae anicteric, conjunctiva pink and moist, TMs pearly, nares patent, no discharge or erythema, pharynx normal Oral cavity: MMM, no lesions Neck:  supple, no lymphadenopathy, no thyromegaly, no masses Heart: RRR, normal S1, S2, no murmurs Lungs: decreased sounds throughout, few faint wheezes, no rhonchi, or rales Pulses: 2+ radial pulses, 2+ pedal pulses, normal cap refill Ext: no edema   Assessment: Encounter Diagnoses  Name Primary?  . Cough Yes  . Dyspnea, unspecified type   . Wheezing   . Former smoker   . Low testosterone      Plan: We discussed his symptoms and ongoing cough.  We suspected that he had COVID infection in late May when we did a virtual consult.  He never got tested.  He did get improvements with treatment at that time which included Z-Pak, Promethazine DM and albuterol.  He is still using the albuterol some.  Given the lung findings today we will send for chest x-ray.  Continue to hydrate well, rest, continue albuterol as needed.  I gave him a sample of Trelegy to begin for cough and inflammation.  We discussed proper use of medication, risk and benefits of medication.  Pending x-ray I will likely add prednisone oral taper and refill cough medicine.  Assuming no secondary pneumonia or other abnormal findings we discussed timeframe to see improvement.  Blood pressure elevated today likely due to the combination of over-the-counter cough and congestion medication he is taking currently.  We discussed cutting out the DayQuil NyQuil combo  Nurse gave testosterone injection today at his request  while he was here .  Shawn Pena was seen today for follow-up.  Diagnoses and all orders for this visit:  Cough -     DG Chest 2 View; Future  Dyspnea, unspecified type -     DG Chest 2 View; Future  Wheezing -     DG Chest 2 View; Future  Former smoker  Low testosterone  Other orders -     Fluticasone-Umeclidin-Vilant (TRELEGY ELLIPTA) 100-62.5-25 MCG/INH AEPB; Inhale 1 puff into the lungs daily.    Follow up: pending chest xray

## 2020-12-17 NOTE — Addendum Note (Signed)
Addended by: Edgar Frisk on: 12/17/2020 04:39 PM   Modules accepted: Orders

## 2020-12-17 NOTE — Telephone Encounter (Signed)
It is time for patient to have testosterone checked again.

## 2020-12-17 NOTE — Patient Instructions (Signed)
Please go to [facility] for your chest xray.   Their hours are 8am - 4:30 pm Monday - Friday.  Take your insurance card with you. ° °[facility] °[phone] ° °[address], [address] °[place], [place] [address] ° °[address] °[place], [place] [address] ° ° °

## 2020-12-19 ENCOUNTER — Other Ambulatory Visit: Payer: Self-pay | Admitting: Medical

## 2020-12-19 MED ORDER — PREDNISONE 20 MG PO TABS: ORAL_TABLET | ORAL | 0 refills | Status: DC

## 2020-12-19 NOTE — Telephone Encounter (Signed)
Ok, then schedule med check follow up including need for labs, or schedule physical if due

## 2020-12-21 NOTE — Telephone Encounter (Signed)
Patient has a lab visit scheduled for 01/07/21. Patient can have testosterone checked at that time and we will schedule physical at check out. Please add labs you would like to be collected at this appointment.

## 2020-12-24 ENCOUNTER — Other Ambulatory Visit: Payer: Self-pay | Admitting: Medical

## 2020-12-24 DIAGNOSIS — R7989 Other specified abnormal findings of blood chemistry: Secondary | ICD-10-CM

## 2020-12-25 ENCOUNTER — Other Ambulatory Visit: Payer: Self-pay | Admitting: Medical

## 2021-01-07 ENCOUNTER — Other Ambulatory Visit: Payer: 59

## 2021-01-07 ENCOUNTER — Other Ambulatory Visit: Payer: Self-pay

## 2021-01-07 ENCOUNTER — Other Ambulatory Visit (INDEPENDENT_AMBULATORY_CARE_PROVIDER_SITE_OTHER): Payer: 59

## 2021-01-07 DIAGNOSIS — R7989 Other specified abnormal findings of blood chemistry: Secondary | ICD-10-CM | POA: Diagnosis not present

## 2021-01-07 MED ORDER — TESTOSTERONE CYPIONATE 200 MG/ML IM SOLN
200.0000 mg | Freq: Once | INTRAMUSCULAR | Status: AC
  Administered 2021-01-07: 200 mg via INTRAMUSCULAR

## 2021-01-10 ENCOUNTER — Telehealth: Payer: Self-pay | Admitting: Medical

## 2021-01-10 ENCOUNTER — Other Ambulatory Visit (INDEPENDENT_AMBULATORY_CARE_PROVIDER_SITE_OTHER): Payer: 59

## 2021-01-10 ENCOUNTER — Other Ambulatory Visit: Payer: Self-pay

## 2021-01-10 ENCOUNTER — Encounter: Payer: Self-pay | Admitting: Family Medicine

## 2021-01-10 ENCOUNTER — Ambulatory Visit (INDEPENDENT_AMBULATORY_CARE_PROVIDER_SITE_OTHER): Payer: 59 | Admitting: Family Medicine

## 2021-01-10 VITALS — BP 118/74 | HR 88 | Temp 100.9°F | Ht 74.0 in | Wt 172.0 lb

## 2021-01-10 DIAGNOSIS — R059 Cough, unspecified: Secondary | ICD-10-CM

## 2021-01-10 DIAGNOSIS — R509 Fever, unspecified: Secondary | ICD-10-CM | POA: Diagnosis not present

## 2021-01-10 LAB — POC COVID19 BINAXNOW: SARS Coronavirus 2 Ag: NEGATIVE

## 2021-01-10 MED ORDER — AZITHROMYCIN 250 MG PO TABS: ORAL_TABLET | ORAL | 0 refills | Status: DC

## 2021-01-10 NOTE — Telephone Encounter (Signed)
Will not prescribe cough meds without evaluation.  Needs visit.  Can be in person so I can listen to his lungs (since he has had 2 virtual visits, not better, and long enough from [place]). Can be virtual if can't make in-person visit

## 2021-01-10 NOTE — Patient Instructions (Signed)
  Drink plenty of water. Use tylenol to keep your fever down. Use the mucinex twice daily. Use Delsym syrup up to twice daily (if cough isn't bad, then just take it at bedtime)  Take the Zpak as directed--2 tablets today, then 1 tablet once daily for the next 4 days. You only take it for 5 days, but it works for 10 days. Go to [facility] [phone] or [address]) for x-ray. I'm treating you for pneumonia, as I hear crackles on your left lung (suggesting pneumonia). We have tested you for COVID.  The rapid test was negative.  We are sending out a PCR test that takes 1-2 days to get back. Please wear an N-95 mask when you are out, and also wear a mask in your house to protect the other household members, until your COVID PCR test result comes back (If negative). Please isolate at home until you have the results.  If your COVID test is positive, then you need to isolate for at least 5 days--today is day 1. If you are feeling much better on Tuesday 7/5 (no fever, cough is much better), then you can return to work but wear a mask 100% of the time

## 2021-01-10 NOTE — Telephone Encounter (Signed)
Pt wife called and states that pt has a horrible cough and is keeping pt up at night and still lingering from when he had covid earlier this month, pt called yesterday and stated he had a cough I offered pt a virtual visit since it had been almost a month since he was seen and pt declined, pt wife called and said she could not take listening to him coughing anymore it was horrible, and is no better from earlier this month.  [id number] [place] [place] [address]

## 2021-01-10 NOTE — Progress Notes (Signed)
Chief Complaint  Patient presents with   Cough    Started yesterday and fatigue. Had some diarrhea at work this morning.    Patient was asked to schedule appointment to evaluate cough after phone call from his wife requesting cough medications.  In that message, she stated that he had lingering cough from when he had COVID earlier in the month, "no better from earlier this month". Pt apparently had called the day prior (6/29) with complaint of cough, denied virtual visit. Based on this info, he was advised to come to the office for eval (thinking that he had COVID over a month ago, per message from wife).  Patient today gives a very different history.  He states that he was here Monday morning for his testosterone injection, and went to [facility], and was just fine. He was fine until he started coughing yesterday--"started out of nowhere."  He took theraflu last night, drank ginger tea and honey, which helped. Today, he woke up very early to go to the bathroom, went back to bed, up at 5am to go to work, didn't cough that much (his tea helped). While at work, at [employer] he started feeling very tired, eyes looked tired in the mirror. He went home at 11 am.  He states the cough is dry, deep.  He denies any shortness of breath, wheezing, cough is nonproductive. He didn't check his temperature at home, no aware of any fever, no chills.  He denies any nausea or vomiting, denies headache. He reports that he took a tablespoon full of Mg citrate with his tea (having some constipation since his illness).  Had diarrhea today.    He stated that his wife is "over the top".  He was sick 3 weeks ago, was told it was a "super flu", not COVID, didn't test for COVID, as he had been sick for a week already, per pt. He states he didn't eat for a week, slept x 3-4 days straight. He reports he got completely better, and was back to work, felt fine until yesterday. He denies h/o asthma. Given inhalers with his  last illness, still has.  Note from 5/23 virtual visit reviewed, had sick contact with grandchild with a cold. Looks like he had declined COVID test and CXR. Upon further review of his chart (AFTER his visit was over, after already being prescribed z-pak), it was noted that [facility] DID prescribe a z-pak to him, and he ultimately did go for the CXR on 6/6   PMH, PSH, [place] reviewed  Outpatient Encounter Medications as of 01/10/2021  Medication Sig Note   acetaminophen (TYLENOL) 325 MG tablet Take 650 mg by mouth every 6 (six) hours as needed. 01/10/2021: Last dose 5am   albuterol (VENTOLIN HFA) 108 (90 Base) MCG/ACT inhaler TAKE 2 PUFFS BY MOUTH EVERY 6 HOURS AS NEEDED FOR WHEEZE OR SHORTNESS OF BREATH 01/10/2021: Took a dose at 2am   amLODipine (NORVASC) 10 MG tablet TAKE 1 TABLET BY MOUTH EVERY DAY    brimonidine-timolol (COMBIGAN) 0.2-0.5 % ophthalmic solution Place 2 drops into both eyes every 12 (twelve) hours.    clindamycin-benzoyl peroxide (BENZACLIN) gel APPLY TO AFFECTED AREA TWICE A DAY    guaiFENesin (MUCINEX) 600 MG 12 hr tablet Take 1,200 mg by mouth 2 (two) times daily. 01/10/2021: Last dose 5am   irbesartan (AVAPRO) 150 MG tablet Take 1 tablet (150 mg total) by mouth daily. 01/10/2021: Taking it once daily for the last week for vertigo   meclizine (ANTIVERT)  25 MG tablet TAKE 1 TABLET BY MOUTH TWICE A DAY    pravastatin (PRAVACHOL) 20 MG tablet TAKE 1 TABLET BY MOUTH EVERY DAY IN THE EVENING    RESTASIS 0.05 % ophthalmic emulsion 1 drop 2 (two) times daily.    testosterone cypionate (DEPOTESTOSTERONE CYPIONATE) 200 MG/ML injection INJECT 1 ML (200 MG TOTAL) INTO THE MUSCLE EVERY 28 (TWENTY-EIGHT) DAYS.    ZIOPTAN 0.0015 % SOLN Apply 1 drop to eye at bedtime.    [DISCONTINUED] ROCKLATAN 0.02-0.005 % SOLN     Fluticasone-Umeclidin-Vilant (TRELEGY ELLIPTA) 100-62.5-25 MCG/INH AEPB Inhale 1 puff into the lungs daily. (Patient not taking: Reported on 01/10/2021) 01/10/2021: Ran out, only given  sample   promethazine-dextromethorphan (PROMETHAZINE-DM) 6.25-15 MG/5ML syrup Take 5 mLs by mouth 4 (four) times daily as needed for cough. (Patient not taking: No sig reported)    sildenafil (REVATIO) 20 MG tablet TAKE 2 TABLETS BY MOUTH ONCE DAILY AS NEEDED PRIOR  TO  SEXUAL  ACTIVITY (Patient not taking: Reported on 01/10/2021)    valACYclovir (VALTREX) 1000 MG tablet 2 tablets BID x 2 days for flare up (Patient not taking: Reported on 01/10/2021)    [DISCONTINUED] predniSONE (DELTASONE) 20 MG tablet 3 tablets daily for 3 days, then 2 tablets daily for 3 days, then 1 tablet daily for 3 days, then 1/2 tablet daily for 3 days.    [DISCONTINUED] Travoprost, BAK Free, (TRAVATAN) 0.004 % SOLN ophthalmic solution     Facility-Administered Encounter Medications as of 01/10/2021  Medication   testosterone cypionate (DEPOTESTOSTERONE CYPIONATE) injection 200 mg    He took Mucinex 12 hour yesterday and this morning Used albuterol at 2am, which seemed to help. Promethazine DM helped (prescribed by Audelia Acton), ran out.   Allergies  Allergen Reactions   Penicillins Rash    ROS:  no f/c/n/v.  Some diarrhea after Mg Citrate use. No headaches, dizziness, chest pain, shortness of breath. +cough. No loss of smell/taste. See HPI   PHYSICAL EXAM:  BP 118/74   Pulse 88   Temp (!) 100.9 F (38.3 C) (Tympanic)   Ht 6\' 2"  (1.88 m)   Wt 172 lb (78 kg)   BMI 22.08 kg/m   Pleasant male, speaking easily, in no distress. Appears slightly older than stated age. During visit he had one spell of coughing--sounded dry, deep.  The rest of the visit he was speaking easily, no coughing, throat-clearing or congestion noted. HEENT: conjunctiva and sclera are clear, EOMI.  TM's and EAC normal. OP is clear. Sinuses nontender Neck: no lymphadenopathy or mass Heart: regular rate and rhythm Lungs: good air movement. No wheezes or ronchi. Crackles noted at L base Extremities: no edema Psych: normal mood, affect, hygiene  and grooming Neuro: alert and oriented, grossly intact cranial nerves, normal gait Skin: normal turgor, no visible rashes.  Rapid COVID test negative.   ASSESSMENT/PLAN:  Cough - suspect pneumonia given crackles at L base.  Too late for CXR today--will go to [facility] tomorrow, Start z-pak today. albuterol prn SOB/wheezing (pt has) - Plan: Novel Coronavirus, NAA ([facility]), azithromycin (ZITHROMAX) 250 MG tablet, DG Chest 2 View  Fever, unspecified fever cause - pt had been unaware of fever.  rapid COVID test negative, PCR sent out. - Plan: Novel Coronavirus, NAA ([facility]), DG Chest 2 View  As reported above--pt didn't tell me about the ABX (noted in chart after visit), had reported he had a viral illness, which had completely resolved.   Addendum: CXR was normal 7/1 COVID PCR was negative  Pt reported feeling better when contacted with his results (some mild fatigue persisted). He was to complete the z-pak that had been prescribed at [facility].

## 2021-01-11 ENCOUNTER — Ambulatory Visit
Admission: RE | Admit: 2021-01-11 | Discharge: 2021-01-11 | Disposition: A | Discharge status: Home / Self Care | Payer: PRIVATE HEALTH INSURANCE | Source: Ambulatory Visit | Attending: Family Medicine | Admitting: Family Medicine

## 2021-01-11 DIAGNOSIS — R509 Fever, unspecified: Secondary | ICD-10-CM

## 2021-01-11 DIAGNOSIS — R059 Cough, unspecified: Secondary | ICD-10-CM

## 2021-01-11 LAB — NOVEL CORONAVIRUS, NAA: SARS-CoV-2, NAA: NOT DETECTED

## 2021-01-11 LAB — SARS-COV-2, NAA 2 DAY TAT

## 2021-01-15 ENCOUNTER — Other Ambulatory Visit: Payer: Self-pay | Admitting: Medical

## 2021-01-21 ENCOUNTER — Other Ambulatory Visit: Payer: Self-pay | Admitting: Medical

## 2021-01-28 ENCOUNTER — Other Ambulatory Visit (INDEPENDENT_AMBULATORY_CARE_PROVIDER_SITE_OTHER): Payer: 59

## 2021-01-28 ENCOUNTER — Other Ambulatory Visit: Payer: Self-pay | Admitting: Medical

## 2021-01-28 ENCOUNTER — Other Ambulatory Visit: Payer: Self-pay

## 2021-01-28 DIAGNOSIS — E291 Testicular hypofunction: Secondary | ICD-10-CM

## 2021-01-28 MED ORDER — TESTOSTERONE CYPIONATE 200 MG/ML IM SOLN
200.0000 mg | INTRAMUSCULAR | Status: DC
  Administered 2021-01-28: 200 mg via INTRAMUSCULAR

## 2021-02-06 ENCOUNTER — Other Ambulatory Visit: Payer: Self-pay | Admitting: Medical

## 2021-02-06 NOTE — Telephone Encounter (Signed)
Are these okay to refill? 

## 2021-02-12 ENCOUNTER — Other Ambulatory Visit: Payer: Self-pay | Admitting: Medical

## 2021-02-12 NOTE — Telephone Encounter (Signed)
Refill , but get him on the schedule for a fasting physical

## 2021-02-12 NOTE — Telephone Encounter (Signed)
Pt is scheduled for September

## 2021-02-18 ENCOUNTER — Other Ambulatory Visit (INDEPENDENT_AMBULATORY_CARE_PROVIDER_SITE_OTHER): Payer: 59

## 2021-02-18 ENCOUNTER — Other Ambulatory Visit: Payer: 59

## 2021-02-18 ENCOUNTER — Other Ambulatory Visit: Payer: Self-pay

## 2021-02-18 DIAGNOSIS — R7989 Other specified abnormal findings of blood chemistry: Secondary | ICD-10-CM

## 2021-02-18 MED ORDER — TESTOSTERONE CYPIONATE 200 MG/ML IM SOLN
200.0000 mg | Freq: Once | INTRAMUSCULAR | Status: AC
  Administered 2021-02-18: 200 mg via INTRAMUSCULAR

## 2021-02-27 ENCOUNTER — Other Ambulatory Visit: Payer: Self-pay | Admitting: Medical

## 2021-02-27 NOTE — Telephone Encounter (Signed)
Pt is scheduled for CPE on 9/19

## 2021-02-27 NOTE — Telephone Encounter (Signed)
I believe he is due for a fasting physical.  His last testosterone level was definitely more than 6 months ago so he needs a fasting physical appointment.  Double check date of last physical.  Either way he is due for testosterone follow-up and lab  I am pretty sure I had sent this request after his last testosterone refill  So let me know how quickly we can get him in for a physical as I do need to check his levels while he is on medication  Once you get him scheduled for appointment within the next 30 days, then I will send a 30-day refill

## 2021-03-11 ENCOUNTER — Other Ambulatory Visit (INDEPENDENT_AMBULATORY_CARE_PROVIDER_SITE_OTHER): Payer: 59

## 2021-03-11 ENCOUNTER — Other Ambulatory Visit: Payer: Self-pay

## 2021-03-11 DIAGNOSIS — E291 Testicular hypofunction: Secondary | ICD-10-CM

## 2021-03-11 MED ORDER — TESTOSTERONE CYPIONATE 200 MG/ML IM SOLN
200.0000 mg | INTRAMUSCULAR | Status: DC
  Administered 2021-03-11: 200 mg via INTRAMUSCULAR

## 2021-03-19 ENCOUNTER — Other Ambulatory Visit: Payer: Self-pay | Admitting: Medical

## 2021-04-01 ENCOUNTER — Other Ambulatory Visit (INDEPENDENT_AMBULATORY_CARE_PROVIDER_SITE_OTHER): Payer: 59

## 2021-04-01 ENCOUNTER — Other Ambulatory Visit: Payer: Self-pay | Admitting: Family Medicine

## 2021-04-01 ENCOUNTER — Ambulatory Visit (INDEPENDENT_AMBULATORY_CARE_PROVIDER_SITE_OTHER): Payer: 59 | Admitting: Medical

## 2021-04-01 ENCOUNTER — Other Ambulatory Visit: Payer: Self-pay

## 2021-04-01 ENCOUNTER — Encounter: Payer: Self-pay | Admitting: Medical

## 2021-04-01 VITALS — BP 130/80 | HR 91 | Ht 74.0 in | Wt 183.4 lb

## 2021-04-01 DIAGNOSIS — Z23 Encounter for immunization: Secondary | ICD-10-CM | POA: Diagnosis not present

## 2021-04-01 DIAGNOSIS — N529 Male erectile dysfunction, unspecified: Secondary | ICD-10-CM

## 2021-04-01 DIAGNOSIS — Z125 Encounter for screening for malignant neoplasm of prostate: Secondary | ICD-10-CM

## 2021-04-01 DIAGNOSIS — E291 Testicular hypofunction: Secondary | ICD-10-CM

## 2021-04-01 DIAGNOSIS — D369 Benign neoplasm, unspecified site: Secondary | ICD-10-CM | POA: Diagnosis not present

## 2021-04-01 DIAGNOSIS — Z7185 Encounter for immunization safety counseling: Secondary | ICD-10-CM

## 2021-04-01 DIAGNOSIS — E785 Hyperlipidemia, unspecified: Secondary | ICD-10-CM

## 2021-04-01 DIAGNOSIS — Z79899 Other long term (current) drug therapy: Secondary | ICD-10-CM

## 2021-04-01 DIAGNOSIS — Z0000 Encounter for general adult medical examination without abnormal findings: Secondary | ICD-10-CM

## 2021-04-01 DIAGNOSIS — R7989 Other specified abnormal findings of blood chemistry: Secondary | ICD-10-CM

## 2021-04-01 DIAGNOSIS — N189 Chronic kidney disease, unspecified: Secondary | ICD-10-CM

## 2021-04-01 DIAGNOSIS — D631 Anemia in chronic kidney disease: Secondary | ICD-10-CM

## 2021-04-01 DIAGNOSIS — N183 Chronic kidney disease, stage 3 unspecified: Secondary | ICD-10-CM

## 2021-04-01 DIAGNOSIS — I10 Essential (primary) hypertension: Secondary | ICD-10-CM

## 2021-04-01 MED ORDER — TESTOSTERONE CYPIONATE 200 MG/ML IM SOLN
200.0000 mg | Freq: Once | INTRAMUSCULAR | Status: AC
  Administered 2021-04-01: 200 mg via INTRAMUSCULAR

## 2021-04-01 NOTE — Patient Instructions (Signed)
This visit was a preventative care visit, also known as wellness visit or routine physical.   Topics typically include healthy lifestyle, diet, exercise, preventative care, vaccinations, sick and well care, proper use of emergency dept and after hours care, as well as other concerns.     Recommendations: Continue to return yearly for your annual wellness and preventative care visits.  This gives [place] a chance to discuss healthy lifestyle, exercise, vaccinations, review your chart record, and perform screenings where appropriate.  I recommend you see your eye doctor yearly for routine vision care.  I recommend you see your dentist yearly for routine dental care including hygiene visits twice yearly.   Vaccination recommendations were reviewed Immunization History  Administered Date(s) Administered   Influenza,inj,Quad PF,6+ Mos 06/04/2020, 04/01/2021   PFIZER(Purple Top)SARS-COV-2 Vaccination 10/09/2019, 10/30/2019, 06/04/2020   Tdap 12/14/2017    Shingles vaccine:  I recommend you have a shingles vaccine to help prevent shingles or herpes zoster outbreak.   Please call your insurer to inquire about coverage for the Shingrix vaccine given in 2 doses.   Some insurers cover this vaccine after age 57, some cover this after age 12.  If your insurer covers this, then call to schedule appointment to have this vaccine here.  Please call insurance about coverage for pneumococcal vaccine as well.     Screening for cancer: Colon cancer screening: I reviewed your colonoscopy on file that is up to date from 2019 You were given stool cards kit to return for hemoccult screening  Please call your insurance company to check coverage for colon cancer screening.  Options may include Cologuard stool test or Colonoscopy.  You should also inquire about which facility the colonoscopy could be performed, and coverage for diagnostic vs screening colonoscopy as coverage may vary.  If you have significant family  history of colon cancer or blood in the stool, then you should only do the colonoscopy, not the Cologuard test.  We discussed PSA, prostate exam, and prostate cancer screening risks/benefits.     Skin cancer screening: Check your skin regularly for new changes, growing lesions, or other lesions of concern Come in for evaluation if you have skin lesions of concern.  Lung cancer screening: If you have a greater than 20 pack year history of tobacco use, then you may qualify for lung cancer screening with a chest CT scan.   Please call your insurance company to inquire about coverage for this test.  We currently don't have screenings for other cancers besides breast, cervical, colon, and lung cancers.  If you have a strong family history of cancer or have other cancer screening concerns, please let me know.    Bone health: Get at least 150 minutes of aerobic exercise weekly Get weight bearing exercise at least once weekly Bone density test:  A bone density test is an imaging test that uses a type of X-ray to measure the amount of calcium and other minerals in your bones. The test may be used to diagnose or screen you for a condition that causes weak or thin bones (osteoporosis), predict your risk for a broken bone (fracture), or determine how well your osteoporosis treatment is working. The bone density test is recommended for females 82 and older, or females or males <29 if certain risk factors such as thyroid disease, long term use of steroids such as for asthma or rheumatological issues, vitamin D deficiency, estrogen deficiency, family history of osteoporosis, self or family history of fragility fracture in first degree  relative.    Heart health: Get at least 150 minutes of aerobic exercise weekly Limit alcohol It is important to maintain a healthy blood pressure and healthy cholesterol numbers  Heart disease screening: Screening for heart disease includes screening for blood pressure,  fasting lipids, glucose/diabetes screening, BMI height to weight ratio, reviewed of smoking status, physical activity, and diet.    Goals include blood pressure 120/80 or less, maintaining a healthy lipid/cholesterol profile, preventing diabetes or keeping diabetes numbers under good control, not smoking or using tobacco products, exercising most days per week or at least 150 minutes per week of exercise, and eating healthy variety of fruits and vegetables, healthy oils, and avoiding unhealthy food choices like fried food, fast food, high sugar and high cholesterol foods.    Other tests may possibly include EKG test, CT coronary calcium score, echocardiogram, exercise treadmill stress test.   Consider CT coronary calcium score to further evaluate coronary atherosclerosis   Medical care options: I recommend you continue to seek care here first for routine care.  We try really hard to have available appointments Monday through Friday daytime hours for sick visits, acute visits, and physicals.  Urgent care should be used for after hours and weekends for significant issues that cannot wait till the next day.  The emergency department should be used for significant potentially life-threatening emergencies.  The emergency department is expensive, can often have long wait times for less significant concerns, so try to utilize primary care, urgent care, or telemedicine when possible to avoid unnecessary trips to the emergency department.  Virtual visits and telemedicine have been introduced since the pandemic started in 2020, and can be convenient ways to receive medical care.  We offer virtual appointments as well to assist you in a variety of options to seek medical care.    Separate significant issues discussed: Hypertension-compliant with medications, routine labs today  Low testosterone-compliant with testosterone injections, routine labs including PSA screening today.  He does have hypergonadism on  exam  Hyperlipidemia-continue statin, routine labs today  Glaucoma-follow-up with eye doctor as scheduled  CKD 3-I reviewed nephrology notes from June 2020-he was advised to monitor blood pressures at home, anemia of renal disease was stable at that time, CKD 3 for sclerosis, stable renal function at that time

## 2021-04-01 NOTE — Progress Notes (Signed)
Subjective:   HPI  Shawn Pena is a 55 y.o. male who presents for Chief Complaint  Patient presents with   cpe    CPE had labs drawn this morning. Flu shot     Patient Care Team: Tysinger, [name] as PCP - General (Family Medicine) Sees dentist Sees eye doctor [name], nephrology [name]   Concerns:      Hypertension - compliant with medications, no c/o.  Not check BPs.  No edema  Hyperlipidemia - compliant with medicaiton without c/o  Low testosterone - coming in every 3 weeks for TST injections.  Doing fine on this  No recent genital herpes flare  Sees eye doctor for glaucoma  ED - uses sildenafil prn without any trouble  Here for flu shot today   Reviewed their medical, surgical, family, social, medication, and allergy history and updated chart as appropriate.  Past Medical History:  Diagnosis Date   Erectile dysfunction    Genital herpes    Glaucoma    Hyperlipidemia    Hypertension    Hypogonadism male    Low testosterone     Past Surgical History:  Procedure Laterality Date   COLONOSCOPY  10/2017   Tubular Adenoma, repeat 5 years, [name]   THROAT SURGERY     vocal cord polyp   WISDOM TOOTH EXTRACTION      Family History  Problem Relation Age of Onset   Kidney disease Mother    Heart disease Mother    Hypertension Mother    Diabetes Mother    Hypertension Father    Hypertension Sister    Diabetes Sister    Hypertension Brother    Cancer Maternal Grandmother        stomach   Stomach cancer Maternal Grandmother    Cancer Maternal Grandfather        prostate   Prostate cancer Maternal Grandfather    Cancer Paternal Grandmother        ovarian   Ovarian cancer Paternal Grandmother    Stroke Paternal Grandfather    Esophageal cancer Maternal Uncle    Colon cancer Neg Hx    Colon polyps Neg Hx    Rectal cancer Neg Hx      Current Outpatient Medications:    acetaminophen (TYLENOL) 325 MG tablet, Take  650 mg by mouth every 6 (six) hours as needed., Disp: , Rfl:    albuterol (VENTOLIN HFA) 108 (90 Base) MCG/ACT inhaler, TAKE 2 PUFFS BY MOUTH EVERY 6 HOURS AS NEEDED FOR WHEEZE OR SHORTNESS OF BREATH, Disp: 8.5 each, Rfl: 0   amLODipine (NORVASC) 10 MG tablet, TAKE 1 TABLET BY MOUTH EVERY DAY, Disp: 30 tablet, Rfl: 2   brimonidine-timolol (COMBIGAN) 0.2-0.5 % ophthalmic solution, Place 2 drops into both eyes every 12 (twelve) hours., Disp: , Rfl:    clindamycin-benzoyl peroxide (BENZACLIN) gel, APPLY TO AFFECTED AREA TWICE A DAY, Disp: 25 g, Rfl: 0   irbesartan (AVAPRO) 150 MG tablet, Take 1 tablet (150 mg total) by mouth daily., Disp: 90 tablet, Rfl: 3   meclizine (ANTIVERT) 25 MG tablet, TAKE 1 TABLET BY MOUTH TWICE A DAY, Disp: 30 tablet, Rfl: 0   pravastatin (PRAVACHOL) 20 MG tablet, TAKE 1 TABLET BY MOUTH EVERY DAY IN THE EVENING, Disp: 30 tablet, Rfl: 0   sildenafil (REVATIO) 20 MG tablet, TAKE 2 TABLETS BY MOUTH ONCE DAILY AS NEEDED PRIOR  TO  SEXUAL  ACTIVITY (Patient taking differently: TAKE 2 TABLETS BY MOUTH  ONCE DAILY AS NEEDED PRIOR  TO  SEXUAL  ACTIVITY), Disp: 60 tablet, Rfl: 11   testosterone cypionate (DEPOTESTOSTERONE CYPIONATE) 200 MG/ML injection, INJECT 1 ML (200 MG TOTAL) INTO THE MUSCLE EVERY 28 (TWENTY-EIGHT) DAYS., Disp: 2 mL, Rfl: 0   valACYclovir (VALTREX) 1000 MG tablet, TAKE 2 TABLETS BY MOUTH TWICE A DAY FOR 2 DAYS FOR FLARE UP, Disp: 20 tablet, Rfl: 0   ZIOPTAN 0.0015 % SOLN, Apply 1 drop to eye at bedtime., Disp: , Rfl:    RESTASIS 0.05 % ophthalmic emulsion, 1 drop 2 (two) times daily. (Patient not taking: Reported on 04/01/2021), Disp: , Rfl:   Allergies  Allergen Reactions   Penicillins Rash     Review of Systems Constitutional: -fever, -chills, -sweats, -unexpected weight change, -decreased appetite, -fatigue Allergy: -sneezing, -itching, -congestion Dermatology: -changing moles, --rash, -lumps ENT: -runny nose, -ear pain, -sore throat, -hoarseness, -sinus  pain, -teeth pain, - ringing in ears, -hearing loss, -nosebleeds Cardiology: -chest pain, -palpitations, -swelling, -difficulty breathing when lying flat, -waking up short of breath Respiratory: -cough, -shortness of breath, -difficulty breathing with exercise or exertion, -wheezing, -coughing up blood Gastroenterology: -abdominal pain, -nausea, -vomiting, -diarrhea, -constipation, -blood in stool, -changes in bowel movement, -difficulty swallowing or eating Hematology: -bleeding, -bruising  Musculoskeletal: -joint aches, -muscle aches, -joint swelling, -back pain, -neck pain, -cramping, -changes in gait Ophthalmology: denies vision changes, eye redness, itching, discharge Urology: -burning with urination, -difficulty urinating, -blood in urine, -urinary frequency, -urgency, -incontinence Neurology: -headache, -weakness, -tingling, -numbness, -memory loss, -falls, -dizziness Psychology: -depressed mood, -agitation, -sleep problems Male GU: no testicular mass, pain, no lymph nodes swollen, no swelling, no rash.  Depression screen [facility] 2/9 04/01/2021 01/23/2020 01/03/2019 12/14/2017 08/17/2017  Decreased Interest 0 0 0 0 0  Down, Depressed, Hopeless 0 0 0 0 0  PHQ - 2 Score 0 0 0 0 0        Objective:  BP 130/80   Pulse 91   Ht 6' 2" (1.88 m)   Wt 183 lb 6.4 oz (83.2 kg)   BMI 23.55 kg/m   General appearance: alert, no distress, WD/WN, African American male Skin: unremarkable HEENT: normocephalic, conjunctiva/corneas normal, sclerae anicteric, PERRLA, EOMi Neck: supple, no lymphadenopathy, no thyromegaly, no masses, normal ROM, no bruits Chest: non tender, normal shape and expansion Heart: RRR, normal S1, S2, no murmurs Lungs: CTA bilaterally, no wheezes, rhonchi, or rales Abdomen: +bs, soft, non tender, non distended, no masses, no hepatomegaly, no splenomegaly, no bruits Back: non tender, normal ROM, no scoliosis Musculoskeletal: upper extremities non tender, no obvious deformity, normal  ROM throughout, lower extremities non tender, no obvious deformity, normal ROM throughout Extremities: no edema, no cyanosis, no clubbing Pulses: 2+ symmetric, upper and lower extremities, normal cap refill Neurological: alert, oriented x 3, CN2-12 intact, strength normal upper extremities and lower extremities, sensation normal throughout, DTRs 2+ throughout, no cerebellar signs, gait normal Psychiatric: normal affect, behavior normal, pleasant  GU: normal male external genitalia,circumcised, hypogonadal, nontender, no masses, no hernia, no lymphadenopathy Rectal: small external hemorrhoids, prostate moderately enlarged, no nodules     Assessment and Plan :   Encounter Diagnoses  Name Primary?   Encounter for health maintenance examination in adult Yes   Vaccine counseling    Tubular adenoma    Screening for prostate cancer    Low testosterone    Hypogonadism in male    Hyperlipidemia, unspecified hyperlipidemia type    High risk medication use    Essential hypertension, benign    Erectile  dysfunction, unspecified erectile dysfunction type    Stage 3 chronic kidney disease, unspecified whether stage 3a or 3b CKD ([place])    Needs flu shot    Anemia of renal disease     This visit was a preventative care visit, also known as wellness visit or routine physical.   Topics typically include healthy lifestyle, diet, exercise, preventative care, vaccinations, sick and well care, proper use of emergency dept and after hours care, as well as other concerns.     Recommendations: Continue to return yearly for your annual wellness and preventative care visits.  This gives [place] a chance to discuss healthy lifestyle, exercise, vaccinations, review your chart record, and perform screenings where appropriate.  I recommend you see your eye doctor yearly for routine vision care.  I recommend you see your dentist yearly for routine dental care including hygiene visits twice yearly.   Vaccination  recommendations were reviewed Immunization History  Administered Date(s) Administered   Influenza,inj,Quad PF,6+ Mos 06/04/2020, 04/01/2021   PFIZER(Purple Top)SARS-COV-2 Vaccination 10/09/2019, 10/30/2019, 06/04/2020   Tdap 12/14/2017    Shingles vaccine:  I recommend you have a shingles vaccine to help prevent shingles or herpes zoster outbreak.   Please call your insurer to inquire about coverage for the Shingrix vaccine given in 2 doses.   Some insurers cover this vaccine after age 57, some cover this after age 3.  If your insurer covers this, then call to schedule appointment to have this vaccine here.  Please call insurance about coverage for pneumococcal vaccine as well.     Screening for cancer: Colon cancer screening: I reviewed your colonoscopy on file that is up to date from 2019 You were given stool cards kit to return for hemoccult screening  Please call your insurance company to check coverage for colon cancer screening.  Options may include Cologuard stool test or Colonoscopy.  You should also inquire about which facility the colonoscopy could be performed, and coverage for diagnostic vs screening colonoscopy as coverage may vary.  If you have significant family history of colon cancer or blood in the stool, then you should only do the colonoscopy, not the Cologuard test.  We discussed PSA, prostate exam, and prostate cancer screening risks/benefits.     Skin cancer screening: Check your skin regularly for new changes, growing lesions, or other lesions of concern Come in for evaluation if you have skin lesions of concern.  Lung cancer screening: If you have a greater than 20 pack year history of tobacco use, then you may qualify for lung cancer screening with a chest CT scan.   Please call your insurance company to inquire about coverage for this test.  We currently don't have screenings for other cancers besides breast, cervical, colon, and lung cancers.  If you have a  strong family history of cancer or have other cancer screening concerns, please let me know.    Bone health: Get at least 150 minutes of aerobic exercise weekly Get weight bearing exercise at least once weekly Bone density test:  A bone density test is an imaging test that uses a type of X-ray to measure the amount of calcium and other minerals in your bones. The test may be used to diagnose or screen you for a condition that causes weak or thin bones (osteoporosis), predict your risk for a broken bone (fracture), or determine how well your osteoporosis treatment is working. The bone density test is recommended for females 62 and older, or females or males <65  if certain risk factors such as thyroid disease, long term use of steroids such as for asthma or rheumatological issues, vitamin D deficiency, estrogen deficiency, family history of osteoporosis, self or family history of fragility fracture in first degree relative.    Heart health: Get at least 150 minutes of aerobic exercise weekly Limit alcohol It is important to maintain a healthy blood pressure and healthy cholesterol numbers  Heart disease screening: Screening for heart disease includes screening for blood pressure, fasting lipids, glucose/diabetes screening, BMI height to weight ratio, reviewed of smoking status, physical activity, and diet.    Goals include blood pressure 120/80 or less, maintaining a healthy lipid/cholesterol profile, preventing diabetes or keeping diabetes numbers under good control, not smoking or using tobacco products, exercising most days per week or at least 150 minutes per week of exercise, and eating healthy variety of fruits and vegetables, healthy oils, and avoiding unhealthy food choices like fried food, fast food, high sugar and high cholesterol foods.    Other tests may possibly include EKG test, CT coronary calcium score, echocardiogram, exercise treadmill stress test.     Consider CT coronary  calcium score to further evaluate coronary atherosclerosis   Medical care options: I recommend you continue to seek care here first for routine care.  We try really hard to have available appointments Monday through Friday daytime hours for sick visits, acute visits, and physicals.  Urgent care should be used for after hours and weekends for significant issues that cannot wait till the next day.  The emergency department should be used for significant potentially life-threatening emergencies.  The emergency department is expensive, can often have long wait times for less significant concerns, so try to utilize primary care, urgent care, or telemedicine when possible to avoid unnecessary trips to the emergency department.  Virtual visits and telemedicine have been introduced since the pandemic started in 2020, and can be convenient ways to receive medical care.  We offer virtual appointments as well to assist you in a variety of options to seek medical care.    Separate significant issues discussed: Hypertension-compliant with medications, routine labs today  Low testosterone-compliant with testosterone injections, routine labs including PSA screening today.  He does have hypergonadism on exam  Hyperlipidemia-continue statin, routine labs today  Glaucoma-follow-up with eye doctor as scheduled  CKD 3-I reviewed nephrology notes from June 2020-he was advised to monitor blood pressures at home, anemia of renal disease was stable at that time, CKD 3 for sclerosis, stable renal function at that time     Reeder was seen today for cpe.  Diagnoses and all orders for this visit:  Encounter for health maintenance examination in adult -     Comprehensive metabolic panel -     CBC -     PSA -     Testosterone -     Lipid panel  Vaccine counseling  Tubular adenoma  Screening for prostate cancer -     PSA  Low testosterone -     Testosterone  Hypogonadism in male -      Testosterone  Hyperlipidemia, unspecified hyperlipidemia type -     Lipid panel  High risk medication use  Essential hypertension, benign  Erectile dysfunction, unspecified erectile dysfunction type  Stage 3 chronic kidney disease, unspecified whether stage 3a or 3b CKD (HCC) -     Comprehensive metabolic panel  Needs flu shot -     Flu Vaccine QUAD 35moIM (Fluarix, Fluzone & Alfiuria Quad PF)  Anemia  of renal disease  Follow-up pending labs, yearly for physical

## 2021-04-02 ENCOUNTER — Other Ambulatory Visit: Payer: Self-pay | Admitting: Medical

## 2021-04-02 LAB — COMPREHENSIVE METABOLIC PANEL
ALT: 14 IU/L (ref 0–44)
AST: 19 IU/L (ref 0–40)
Albumin/Globulin Ratio: 1.7 (ref 1.2–2.2)
Albumin: 4.6 g/dL (ref 3.8–4.9)
Alkaline Phosphatase: 61 IU/L (ref 44–121)
BUN/Creatinine Ratio: 13 (ref 9–20)
BUN: 22 mg/dL (ref 6–24)
Bilirubin Total: 0.3 mg/dL (ref 0.0–1.2)
CO2: 24 mmol/L (ref 20–29)
Calcium: 9.6 mg/dL (ref 8.7–10.2)
Chloride: 107 mmol/L — ABNORMAL HIGH (ref 96–106)
Creatinine, Ser: 1.66 mg/dL — ABNORMAL HIGH (ref 0.76–1.27)
Globulin, Total: 2.7 g/dL (ref 1.5–4.5)
Glucose: 101 mg/dL — ABNORMAL HIGH (ref 65–99)
Potassium: 4 mmol/L (ref 3.5–5.2)
Sodium: 144 mmol/L (ref 134–144)
Total Protein: 7.3 g/dL (ref 6.0–8.5)
eGFR: 48 mL/min/{1.73_m2} — ABNORMAL LOW (ref >59)

## 2021-04-02 LAB — LIPID PANEL
Chol/HDL Ratio: 3.2 ratio (ref 0.0–5.0)
Cholesterol, Total: 121 mg/dL (ref 100–199)
HDL: 38 mg/dL — ABNORMAL LOW (ref >39)
LDL Chol Calc (NIH): 67 mg/dL (ref 0–99)
Triglycerides: 78 mg/dL (ref 0–149)
VLDL Cholesterol Cal: 16 mg/dL (ref 5–40)

## 2021-04-02 LAB — CBC
Hematocrit: 39.5 % (ref 37.5–51.0)
Hemoglobin: 12.7 g/dL — ABNORMAL LOW (ref 13.0–17.7)
MCH: 29.7 pg (ref 26.6–33.0)
MCHC: 32.2 g/dL (ref 31.5–35.7)
MCV: 93 fL (ref 79–97)
Platelets: 163 10*3/uL (ref 150–450)
RBC: 4.27 x10E6/uL (ref 4.14–5.80)
RDW: 13.5 % (ref 11.6–15.4)
WBC: 7.5 10*3/uL (ref 3.4–10.8)

## 2021-04-02 LAB — TESTOSTERONE: Testosterone: 180 ng/dL — ABNORMAL LOW (ref 264–916)

## 2021-04-02 LAB — PSA: Prostate Specific Ag, Serum: 1.7 ng/mL (ref 0.0–4.0)

## 2021-04-02 MED ORDER — PRAVASTATIN SODIUM 20 MG PO TABS: 20.0000 mg | ORAL_TABLET | Freq: Every day | ORAL | 3 refills | Status: DC

## 2021-04-02 MED ORDER — IRBESARTAN 150 MG PO TABS: 150.0000 mg | ORAL_TABLET | Freq: Every day | ORAL | 3 refills | Status: DC

## 2021-04-02 MED ORDER — AMLODIPINE BESYLATE 10 MG PO TABS: 10.0000 mg | ORAL_TABLET | Freq: Every day | ORAL | 3 refills | Status: DC

## 2021-04-02 MED ORDER — VALACYCLOVIR HCL 1 G PO TABS: ORAL_TABLET | ORAL | 3 refills | Status: DC

## 2021-04-04 ENCOUNTER — Other Ambulatory Visit: Payer: Self-pay | Admitting: Medical

## 2021-04-07 ENCOUNTER — Telehealth: Payer: Self-pay

## 2021-04-07 NOTE — Telephone Encounter (Signed)
P.A. SILDENAFIL  

## 2021-04-22 ENCOUNTER — Other Ambulatory Visit: Payer: Self-pay

## 2021-04-22 ENCOUNTER — Other Ambulatory Visit (INDEPENDENT_AMBULATORY_CARE_PROVIDER_SITE_OTHER): Payer: 59

## 2021-04-22 ENCOUNTER — Telehealth: Payer: Self-pay

## 2021-04-22 DIAGNOSIS — E291 Testicular hypofunction: Secondary | ICD-10-CM | POA: Diagnosis not present

## 2021-04-22 MED ORDER — TESTOSTERONE CYPIONATE 200 MG/ML IM SOLN
200.0000 mg | INTRAMUSCULAR | Status: DC
  Administered 2021-04-22 – 2021-06-03 (×3): 200 mg via INTRAMUSCULAR

## 2021-04-22 NOTE — Telephone Encounter (Signed)
Pt. Checked out from last visit and wanted to talk to you about his last lab check . He wanted me to send a message to you to see if you could call him when ever you have a chance to discuss.

## 2021-04-24 NOTE — Telephone Encounter (Signed)
P.A.denied, not covered for ED.  Left pt message & to give option of Good Rx, would be much cheaper at [facility]

## 2021-04-25 NOTE — Telephone Encounter (Signed)
Pt would like to switch to [facility] & use Good Rx card.  [facility] [address].  Can pt have refill to [facility]

## 2021-04-30 ENCOUNTER — Other Ambulatory Visit: Payer: Self-pay | Admitting: Medical

## 2021-04-30 MED ORDER — SILDENAFIL CITRATE 20 MG PO TABS: ORAL_TABLET | ORAL | 4 refills | Status: DC

## 2021-04-30 NOTE — Telephone Encounter (Signed)
Rx was refilled by [facility] & I called in Good Rx card and went thru for $9.71.  Pt informed

## 2021-05-06 ENCOUNTER — Other Ambulatory Visit: Payer: Self-pay | Admitting: Medical

## 2021-05-06 NOTE — Telephone Encounter (Signed)
Per pt he does not need this medication

## 2021-05-12 ENCOUNTER — Other Ambulatory Visit: Payer: Self-pay | Admitting: Medical

## 2021-05-13 ENCOUNTER — Other Ambulatory Visit (INDEPENDENT_AMBULATORY_CARE_PROVIDER_SITE_OTHER): Payer: 59

## 2021-05-13 ENCOUNTER — Other Ambulatory Visit: Payer: Self-pay

## 2021-05-13 DIAGNOSIS — E291 Testicular hypofunction: Secondary | ICD-10-CM | POA: Diagnosis not present

## 2021-05-13 NOTE — Telephone Encounter (Signed)
Pt is requesting a refill on this medication

## 2021-05-27 ENCOUNTER — Ambulatory Visit (INDEPENDENT_AMBULATORY_CARE_PROVIDER_SITE_OTHER): Payer: 59 | Admitting: Medical

## 2021-05-27 ENCOUNTER — Other Ambulatory Visit: Payer: Self-pay

## 2021-05-27 VITALS — BP 130/88 | HR 84 | Temp 97.9°F | Wt 186.0 lb

## 2021-05-27 DIAGNOSIS — R42 Dizziness and giddiness: Secondary | ICD-10-CM | POA: Diagnosis not present

## 2021-05-27 DIAGNOSIS — N183 Chronic kidney disease, stage 3 unspecified: Secondary | ICD-10-CM

## 2021-05-27 DIAGNOSIS — I10 Essential (primary) hypertension: Secondary | ICD-10-CM

## 2021-05-27 DIAGNOSIS — R0683 Snoring: Secondary | ICD-10-CM | POA: Insufficient documentation

## 2021-05-27 DIAGNOSIS — D631 Anemia in chronic kidney disease: Secondary | ICD-10-CM

## 2021-05-27 DIAGNOSIS — N189 Chronic kidney disease, unspecified: Secondary | ICD-10-CM

## 2021-05-27 DIAGNOSIS — R400 Somnolence: Secondary | ICD-10-CM

## 2021-05-27 NOTE — Progress Notes (Signed)
Subjective:  Shawn Pena is a 55 y.o. male who presents for Chief Complaint  Patient presents with   Dizziness    Vertigo x 2 weeks. Dizziness with movement     Here for dizziness.  Been dealing with vertigo the past 2 weeks, intermittent, can be brief.  No headache, no confusion, no slurred speech.  No weakness, no fall.  Does sometimes wake up in the night in sleep with hands/arms gone numb, but no other numbness or tingling.    No palpitations, no syncope, no sob.  No ringing in the ears, no hearing loss.   Using meclizine prn QHS but this makes him sleepy in the day time.  He endorses snoring loudly and daytime somnolence, but no witnessed apnea.      HTN - compliant with medication   No other aggravating or relieving factors.    No other c/o.  Past Medical History:  Diagnosis Date   Erectile dysfunction    Genital herpes    Glaucoma    Hyperlipidemia    Hypertension    Hypogonadism male    Low testosterone    Current Outpatient Medications on File Prior to Visit  Medication Sig Dispense Refill   acetaminophen (TYLENOL) 325 MG tablet Take 650 mg by mouth every 6 (six) hours as needed.     albuterol (VENTOLIN HFA) 108 (90 Base) MCG/ACT inhaler TAKE 2 PUFFS BY MOUTH EVERY 6 HOURS AS NEEDED FOR WHEEZE OR SHORTNESS OF BREATH 8.5 each 0   amLODipine (NORVASC) 10 MG tablet Take 1 tablet (10 mg total) by mouth daily. 90 tablet 3   brimonidine-timolol (COMBIGAN) 0.2-0.5 % ophthalmic solution Place 2 drops into both eyes every 12 (twelve) hours.     clindamycin-benzoyl peroxide (BENZACLIN) gel APPLY TO AFFECTED AREA TWICE A DAY 25 g 0   irbesartan (AVAPRO) 150 MG tablet Take 1 tablet (150 mg total) by mouth daily. 90 tablet 3   meclizine (ANTIVERT) 25 MG tablet TAKE 1 TABLET BY MOUTH TWICE A DAY 30 tablet 0   pravastatin (PRAVACHOL) 20 MG tablet Take 1 tablet (20 mg total) by mouth daily. 90 tablet 3   RESTASIS 0.05 % ophthalmic emulsion 1 drop 2 (two) times daily.      sildenafil (REVATIO) 20 MG tablet TAKE 2 TABLETS BY MOUTH ONCE DAILY AS NEEDED PRIOR  TO  SEXUAL  ACTIVITY 60 tablet 4   testosterone cypionate (DEPOTESTOSTERONE CYPIONATE) 200 MG/ML injection INJECT 1 ML (200 MG TOTAL) INTO THE MUSCLE EVERY 28 (TWENTY-EIGHT) DAYS. 1 mL 1   valACYclovir (VALTREX) 1000 MG tablet TAKE 2 TABLETS BY MOUTH TWICE A DAY FOR 2 DAYS FOR FLARE UP 20 tablet 3   ZIOPTAN 0.0015 % SOLN Apply 1 drop to eye at bedtime.     Current Facility-Administered Medications on File Prior to Visit  Medication Dose Route Frequency Provider Last Rate Last Admin   testosterone cypionate (DEPOTESTOSTERONE CYPIONATE) injection 200 mg  200 mg Intramuscular Q14 Days Carlena Hurl, PA-C   200 mg at 05/13/21 9211     The following portions of the patient's history were reviewed and updated as appropriate: allergies, current medications, past family history, past medical history, past social history, past surgical history and problem list.  ROS Otherwise as in subjective above  Objective: BP 130/88   Pulse 84   Temp 97.9 F (36.6 C)   Wt 186 lb (84.4 kg)   BMI 23.88 kg/m   General appearance: alert, no distress, well developed, well nourished  HEENT: normocephalic, sclerae anicteric, conjunctiva pink and moist, TMs pearly, nares patent, no discharge or erythema, pharynx normal Neck: supple, no lymphadenopathy, no thyromegaly, no masses, no bruits Heart: RRR, normal S1, S2, no murmurs Lungs: CTA bilaterally, no wheezes, rhonchi, or rales Pulses: 2+ radial pulses, 2+ pedal pulses, normal cap refill Ext: no edema Neuro: slight asymmetry of both eyes with motions chronically, but EOMi, PERRLA, CN2-12 intact, nonfocal exam, A&Ox 3 Psych: pleasant, answers questions appropriately    Assessment: Encounter Diagnoses  Name Primary?   Dizziness Yes   Vertigo    Stage 3 chronic kidney disease, unspecified whether stage 3a or 3b CKD (HCC)    Essential hypertension, benign    Anemia of  renal disease    Snoring    Daytime somnolence      Plan: Dizziness, vertigo - he had prolonged problems with this a year ago and intermittently.  Referral to PT for maneuvers to help with vertigo, continue Meclizine prn, but gets quite a bit of sedation with this.  Referral for MRI brain given recurrent problems with dizziness.  Labs as below  HTN - continue current medication  CKD - stable, continue good hydration  Anemia of renal disease, stable  Snoring, daytime somnolence - referral for sleep study   Wyman was seen today for dizziness.  Diagnoses and all orders for this visit:  Dizziness -     Vitamin B12 -     TSH -     MR Brain Wo Contrast; Future -     Ambulatory referral to Physical Therapy  Vertigo  Stage 3 chronic kidney disease, unspecified whether stage 3a or 3b CKD (HCC) -     Vitamin B12 -     TSH -     MR Brain Wo Contrast; Future  Essential hypertension, benign -     Vitamin B12 -     TSH -     MR Brain Wo Contrast; Future  Anemia of renal disease -     Vitamin B12 -     TSH -     MR Brain Wo Contrast; Future  Snoring  Daytime somnolence    Follow up: pending labs, referral, sleep study

## 2021-05-28 ENCOUNTER — Encounter: Payer: 59 | Admitting: Physical Therapy

## 2021-05-28 LAB — TSH: TSH: 0.814 u[IU]/mL (ref 0.450–4.500)

## 2021-05-28 LAB — VITAMIN B12: Vitamin B-12: 728 pg/mL (ref 232–1245)

## 2021-06-02 ENCOUNTER — Other Ambulatory Visit: Payer: Self-pay | Admitting: Medical

## 2021-06-03 ENCOUNTER — Other Ambulatory Visit (INDEPENDENT_AMBULATORY_CARE_PROVIDER_SITE_OTHER): Payer: 59

## 2021-06-03 ENCOUNTER — Other Ambulatory Visit: Payer: Self-pay

## 2021-06-03 DIAGNOSIS — R7989 Other specified abnormal findings of blood chemistry: Secondary | ICD-10-CM

## 2021-06-03 DIAGNOSIS — E291 Testicular hypofunction: Secondary | ICD-10-CM

## 2021-06-17 ENCOUNTER — Ambulatory Visit
Admission: RE | Admit: 2021-06-17 | Discharge: 2021-06-17 | Disposition: A | Discharge status: Home / Self Care | Payer: 59 | Source: Ambulatory Visit | Attending: Medical | Admitting: Medical

## 2021-06-17 DIAGNOSIS — N189 Chronic kidney disease, unspecified: Secondary | ICD-10-CM

## 2021-06-17 DIAGNOSIS — I10 Essential (primary) hypertension: Secondary | ICD-10-CM

## 2021-06-17 DIAGNOSIS — D631 Anemia in chronic kidney disease: Secondary | ICD-10-CM

## 2021-06-17 DIAGNOSIS — N183 Chronic kidney disease, stage 3 unspecified: Secondary | ICD-10-CM

## 2021-06-17 DIAGNOSIS — R42 Dizziness and giddiness: Secondary | ICD-10-CM

## 2021-06-24 ENCOUNTER — Other Ambulatory Visit (INDEPENDENT_AMBULATORY_CARE_PROVIDER_SITE_OTHER): Payer: 59

## 2021-06-24 ENCOUNTER — Other Ambulatory Visit: Payer: Self-pay

## 2021-06-24 DIAGNOSIS — E291 Testicular hypofunction: Secondary | ICD-10-CM | POA: Diagnosis not present

## 2021-06-24 DIAGNOSIS — R7989 Other specified abnormal findings of blood chemistry: Secondary | ICD-10-CM

## 2021-06-24 MED ORDER — TESTOSTERONE CYPIONATE 200 MG/ML IM SOLN
200.0000 mg | Freq: Once | INTRAMUSCULAR | Status: AC
  Administered 2021-06-24: 200 mg via INTRAMUSCULAR

## 2021-07-16 ENCOUNTER — Other Ambulatory Visit: Payer: Self-pay | Admitting: Medical

## 2021-07-22 ENCOUNTER — Other Ambulatory Visit (INDEPENDENT_AMBULATORY_CARE_PROVIDER_SITE_OTHER): Payer: Managed Care, Other (non HMO)

## 2021-07-22 ENCOUNTER — Other Ambulatory Visit: Payer: Self-pay

## 2021-07-22 DIAGNOSIS — E291 Testicular hypofunction: Secondary | ICD-10-CM

## 2021-07-22 DIAGNOSIS — R7989 Other specified abnormal findings of blood chemistry: Secondary | ICD-10-CM

## 2021-07-22 MED ORDER — TESTOSTERONE CYPIONATE 200 MG/ML IM SOLN
200.0000 mg | Freq: Once | INTRAMUSCULAR | Status: AC
  Administered 2021-07-22: 200 mg via INTRAMUSCULAR

## 2021-08-12 ENCOUNTER — Other Ambulatory Visit: Payer: Managed Care, Other (non HMO)

## 2021-08-26 ENCOUNTER — Other Ambulatory Visit: Payer: Managed Care, Other (non HMO)

## 2021-08-26 ENCOUNTER — Other Ambulatory Visit (INDEPENDENT_AMBULATORY_CARE_PROVIDER_SITE_OTHER): Payer: Managed Care, Other (non HMO)

## 2021-08-26 DIAGNOSIS — R7989 Other specified abnormal findings of blood chemistry: Secondary | ICD-10-CM

## 2021-08-26 DIAGNOSIS — E291 Testicular hypofunction: Secondary | ICD-10-CM

## 2021-08-26 MED ORDER — TESTOSTERONE CYPIONATE 200 MG/ML IM SOLN
200.0000 mg | Freq: Once | INTRAMUSCULAR | Status: AC
  Administered 2021-08-26: 200 mg via INTRAMUSCULAR

## 2021-09-16 ENCOUNTER — Telehealth: Payer: Self-pay | Admitting: Medical

## 2021-09-16 ENCOUNTER — Other Ambulatory Visit (INDEPENDENT_AMBULATORY_CARE_PROVIDER_SITE_OTHER): Payer: Managed Care, Other (non HMO)

## 2021-09-16 ENCOUNTER — Other Ambulatory Visit: Payer: Self-pay | Admitting: Medical

## 2021-09-16 DIAGNOSIS — E291 Testicular hypofunction: Secondary | ICD-10-CM | POA: Diagnosis not present

## 2021-09-16 MED ORDER — TESTOSTERONE CYPIONATE 200 MG/ML IM SOLN: 200.0000 mg | INTRAMUSCULAR | 1 refills | Status: DC

## 2021-09-16 MED ORDER — TESTOSTERONE CYPIONATE 200 MG/ML IM SOLN
200.0000 mg | INTRAMUSCULAR | Status: DC
  Administered 2021-09-16: 200 mg via INTRAMUSCULAR

## 2021-09-16 NOTE — Telephone Encounter (Signed)
Pt called and needs refill on test [facility] [address] ?

## 2021-10-03 ENCOUNTER — Telehealth: Payer: Self-pay | Admitting: Medical

## 2021-10-03 ENCOUNTER — Other Ambulatory Visit: Payer: Self-pay | Admitting: Medical

## 2021-10-03 MED ORDER — TESTOSTERONE CYPIONATE 200 MG/ML IM SOLN: 200.0000 mg | INTRAMUSCULAR | 1 refills | Status: DC

## 2021-10-03 NOTE — Telephone Encounter (Signed)
Pt called and is requesting a refill on his testosterone [facility] to [id number] [place], [place] - [address]?

## 2021-10-07 ENCOUNTER — Other Ambulatory Visit: Payer: Managed Care, Other (non HMO)

## 2021-10-07 ENCOUNTER — Other Ambulatory Visit: Payer: Self-pay | Admitting: Medical

## 2021-10-07 ENCOUNTER — Telehealth: Payer: Self-pay | Admitting: Internal Medicine

## 2021-10-07 MED ORDER — TESTOSTERONE CYPIONATE 200 MG/ML IM SOLN
200.0000 mg | INTRAMUSCULAR | 1 refills | Status: DC
Start: 2021-10-07 — End: 2021-11-04

## 2021-10-07 NOTE — Telephone Encounter (Signed)
Pt was notified of results

## 2021-10-07 NOTE — Telephone Encounter (Signed)
Pt called and left a voicemail saying he needs his refill on his testosterone but the directions say every 28 days and pt comes in every 3 weeks to get his injection so they won't fill at pharmacy ?

## 2021-10-16 ENCOUNTER — Telehealth: Payer: Self-pay

## 2021-10-16 ENCOUNTER — Other Ambulatory Visit: Payer: Self-pay | Admitting: Medical

## 2021-10-16 MED ORDER — CLINDAMYCIN PHOS-BENZOYL PEROX 1-5 % EX GEL: Freq: Two times a day (BID) | CUTANEOUS | 0 refills | Status: DC

## 2021-10-16 NOTE — Telephone Encounter (Signed)
Pt is requesting refill on Clindamycin gel. [facility] [address] ?

## 2021-10-21 ENCOUNTER — Other Ambulatory Visit (INDEPENDENT_AMBULATORY_CARE_PROVIDER_SITE_OTHER): Payer: Managed Care, Other (non HMO)

## 2021-10-21 DIAGNOSIS — E291 Testicular hypofunction: Secondary | ICD-10-CM | POA: Diagnosis not present

## 2021-10-21 MED ORDER — TESTOSTERONE CYPIONATE 200 MG/ML IM SOLN
200.0000 mg | INTRAMUSCULAR | Status: DC
  Administered 2021-10-21: 200 mg via INTRAMUSCULAR

## 2021-11-04 ENCOUNTER — Telehealth: Payer: Self-pay

## 2021-11-04 ENCOUNTER — Other Ambulatory Visit: Payer: Self-pay | Admitting: Medical

## 2021-11-04 MED ORDER — TESTOSTERONE CYPIONATE 200 MG/ML IM SOLN
200.0000 mg | INTRAMUSCULAR | 1 refills | Status: DC
Start: 2021-11-04 — End: 2022-01-06

## 2021-11-04 NOTE — Telephone Encounter (Signed)
Pt. Called stating he wanted to start getting his medication refills at [facility] on [address] now. He stated the only one he needed now refilled was his testosterone. His last apt was 06/06/21 and next apt is 04/07/22. ?

## 2021-11-11 ENCOUNTER — Other Ambulatory Visit (INDEPENDENT_AMBULATORY_CARE_PROVIDER_SITE_OTHER): Payer: Managed Care, Other (non HMO)

## 2021-11-11 DIAGNOSIS — R7989 Other specified abnormal findings of blood chemistry: Secondary | ICD-10-CM

## 2021-11-11 MED ORDER — TESTOSTERONE CYPIONATE 200 MG/ML IM SOLN
200.0000 mg | Freq: Once | INTRAMUSCULAR | Status: AC
  Administered 2021-11-11: 200 mg via INTRAMUSCULAR

## 2021-11-20 ENCOUNTER — Ambulatory Visit: Payer: Commercial Managed Care - HMO | Admitting: Medical

## 2021-11-20 VITALS — BP 120/78 | HR 68 | Temp 97.8°F | Wt 175.2 lb

## 2021-11-20 DIAGNOSIS — R197 Diarrhea, unspecified: Secondary | ICD-10-CM

## 2021-11-20 DIAGNOSIS — Z79899 Other long term (current) drug therapy: Secondary | ICD-10-CM

## 2021-11-20 DIAGNOSIS — Z87891 Personal history of nicotine dependence: Secondary | ICD-10-CM

## 2021-11-20 DIAGNOSIS — E860 Dehydration: Secondary | ICD-10-CM

## 2021-11-20 DIAGNOSIS — R112 Nausea with vomiting, unspecified: Secondary | ICD-10-CM

## 2021-11-20 DIAGNOSIS — K529 Noninfective gastroenteritis and colitis, unspecified: Secondary | ICD-10-CM

## 2021-11-20 DIAGNOSIS — R490 Dysphonia: Secondary | ICD-10-CM

## 2021-11-20 DIAGNOSIS — R0600 Dyspnea, unspecified: Secondary | ICD-10-CM

## 2021-11-20 NOTE — Progress Notes (Signed)
Subjective: ? Shawn Pena is a 56 y.o. male who presents for ?Chief Complaint  ?Patient presents with  ? stomach virus  ?  Stomach virus. Was having issues on Sunday. Doing better now- but just weak and tired  ?   ?Here for f/u on illness.  Over the past weekend had abdominal upset, nausea, vomiting, diarrhea all day up until 2 days ago.   Finally calmed down 2 days ago with vomiting and diarrhea.  Since then appetite still down, tired.  Hasn't been back to work since this started.    ? ?No recent new animal exposure ,no recent travel.   No sick contacts with same symptoms.   ? ?Had went to a festival in [place], may have eaten some things that may have caused food poisoning. ? ?Smoked 20 years, quit  12 years ago ? ?Ever since having some bronchitis really bad last year he has felt like he was voice has not been the same.  He used to sing but now cannot seem, his voice gives out.  He has had some shortness of breath occasionally. ? ?Hypertension-compliant with medications as usual ? ?No other aggravating or relieving factors.   ? ?No other c/o. ? ?Past Medical History:  ?Diagnosis Date  ? Erectile dysfunction   ? Genital herpes   ? Glaucoma   ? Hyperlipidemia   ? Hypertension   ? Hypogonadism male   ? Low testosterone   ? ?Current Outpatient Medications on File Prior to Visit  ?Medication Sig Dispense Refill  ? acetaminophen (TYLENOL) 325 MG tablet Take 650 mg by mouth every 6 (six) hours as needed.    ? albuterol (VENTOLIN HFA) 108 (90 Base) MCG/ACT inhaler TAKE 2 PUFFS BY MOUTH EVERY 6 HOURS AS NEEDED FOR WHEEZE OR SHORTNESS OF BREATH 8.5 each 0  ? amLODipine (NORVASC) 10 MG tablet Take 1 tablet (10 mg total) by mouth daily. 90 tablet 3  ? brimonidine-timolol (COMBIGAN) 0.2-0.5 % ophthalmic solution Place 2 drops into both eyes every 12 (twelve) hours.    ? clindamycin-benzoyl peroxide (BENZACLIN) gel Apply topically 2 (two) times daily. 25 g 0  ? irbesartan (AVAPRO) 150 MG tablet Take 1 tablet (150 mg total)  by mouth daily. 90 tablet 3  ? meclizine (ANTIVERT) 25 MG tablet TAKE 1 TABLET BY MOUTH TWICE A DAY 30 tablet 0  ? pravastatin (PRAVACHOL) 20 MG tablet Take 1 tablet (20 mg total) by mouth daily. 90 tablet 3  ? RESTASIS 0.05 % ophthalmic emulsion 1 drop 2 (two) times daily.    ? testosterone cypionate (DEPOTESTOSTERONE CYPIONATE) 200 MG/ML injection Inject 1 mL (200 mg total) into the muscle every 21 ( twenty-one) days. 1 mL 1  ? valACYclovir (VALTREX) 1000 MG tablet TAKE 2 TABLETS BY MOUTH TWICE A DAY FOR 2 DAYS FOR FLARE UP 20 tablet 3  ? ZIOPTAN 0.0015 % SOLN Apply 1 drop to eye at bedtime.    ? sildenafil (REVATIO) 20 MG tablet TAKE 2 TABLETS BY MOUTH ONCE DAILY AS NEEDED PRIOR  TO  SEXUAL  ACTIVITY 60 tablet 4  ? ?Current Facility-Administered Medications on File Prior to Visit  ?Medication Dose Route Frequency Provider Last Rate Last Admin  ? testosterone cypionate (DEPOTESTOSTERONE CYPIONATE) injection 200 mg  200 mg Intramuscular Q14 Days Carlena Hurl, PA-C   200 mg at 09/16/21 1551  ? testosterone cypionate (DEPOTESTOSTERONE CYPIONATE) injection 200 mg  200 mg Intramuscular Q28 days Carlena Hurl, PA-C   200 mg at 10/21/21  0847  ? ? ? ?The following portions of the patient's history were reviewed and updated as appropriate: allergies, current medications, past family history, past medical history, past social history, past surgical history and problem list. ? ?ROS ?Otherwise as in subjective above ? ? ? ?Objective: ?BP 120/78   Pulse 68   Temp 97.8 ?F (36.6 ?C)   Wt 175 lb 3.2 oz (79.5 kg)   BMI 22.49 kg/m?  ? ?Wt Readings from Last 3 Encounters:  ?11/20/21 175 lb 3.2 oz (79.5 kg)  ?05/27/21 186 lb (84.4 kg)  ?04/01/21 183 lb 6.4 oz (83.2 kg)  ? ?General appearance: alert, no distress, well developed, well nourished, somewhat chronically hoarse voice ?HEENT: normocephalic, sclerae anicteric, conjunctiva pink and moist, TMs pearly, nares patent, no discharge or erythema, pharynx normal ?Oral  cavity: a little dry MM, no lesions ?Neck: supple, no lymphadenopathy, no thyromegaly, no masses ?Heart: RRR, normal S1, S2, no murmurs ?Lungs: CTA bilaterally, no wheezes, rhonchi, or rales ?Abdomen: +bs, soft, non tender, non distended, no masses, no hepatomegaly, no splenomegaly ?Pulses: 2+ radial pulses, 2+ pedal pulses, normal cap refill ?Ext: no edema ? ? ?Assessment: ?Encounter Diagnoses  ?Name Primary?  ? Nausea and vomiting, unspecified vomiting type Yes  ? Diarrhea, unspecified type   ? Dehydration   ? Gastroenteritis   ? Former smoker   ? Dyspnea, unspecified type   ? Hoarse voice quality   ? High risk medication use   ? ? ? ?Plan: ?Symptoms this past weekend suggest viral gastroenteritis.  His diarrhea and vomiting is finally resolved over the past 2 days.  We discussed that his weakness may just be body recuperating at this point.  However there does appear to have some weight loss component which may or may not be new within the last week.  Labs today. ? ?Hypertension-labs today, continue current medications.  Discussed risk and benefits of medication particular in light of dehydration with ARB ? ?Former smoker, dyspnea occasional-chest x-ray last year normal.  PFTs today.  ? ?Hoarse voice - recommended ENT consult. ? ?Former smoker with PFT abnormal today.  We discussed the significance of this.  He does not necessarily have a lot of shortness of breath but has some voice changes.  We discussed possible trial of inhaled steroid.  He wants to hold off on this for now and start with ENT consult. ? ? ?[name] was seen today for stomach virus. ? ?Diagnoses and all orders for this visit: ? ?Nausea and vomiting, unspecified vomiting type ?-     Basic metabolic panel ?-     CBC ? ?Diarrhea, unspecified type ?-     Basic metabolic panel ?-     CBC ? ?Dehydration ?-     Basic metabolic panel ?-     CBC ? ?Gastroenteritis ?-     Basic metabolic panel ?-     CBC ? ?Former smoker ?-     Ambulatory referral to  ENT ? ?Dyspnea, unspecified type ?-     Spirometry with Graph ?-     Ambulatory referral to ENT ? ?Hoarse voice quality ?-     Spirometry with Graph ?-     Ambulatory referral to ENT ? ?High risk medication use ? ? ? ?Follow up: pending labs ?

## 2021-11-21 LAB — CBC
Hematocrit: 40.4 % (ref 37.5–51.0)
Hemoglobin: 13.4 g/dL (ref 13.0–17.7)
MCH: 29.7 pg (ref 26.6–33.0)
MCHC: 33.2 g/dL (ref 31.5–35.7)
MCV: 90 fL (ref 79–97)
Platelets: 214 10*3/uL (ref 150–450)
RBC: 4.51 x10E6/uL (ref 4.14–5.80)
RDW: 13.2 % (ref 11.6–15.4)
WBC: 6.3 10*3/uL (ref 3.4–10.8)

## 2021-11-21 LAB — BASIC METABOLIC PANEL
BUN/Creatinine Ratio: 16 (ref 9–20)
BUN: 27 mg/dL — ABNORMAL HIGH (ref 6–24)
CO2: 21 mmol/L (ref 20–29)
Calcium: 9.7 mg/dL (ref 8.7–10.2)
Chloride: 106 mmol/L (ref 96–106)
Creatinine, Ser: 1.7 mg/dL — ABNORMAL HIGH (ref 0.76–1.27)
Glucose: 97 mg/dL (ref 70–99)
Potassium: 4.7 mmol/L (ref 3.5–5.2)
Sodium: 142 mmol/L (ref 134–144)
eGFR: 46 mL/min/{1.73_m2} — ABNORMAL LOW (ref >59)

## 2021-12-02 ENCOUNTER — Other Ambulatory Visit (INDEPENDENT_AMBULATORY_CARE_PROVIDER_SITE_OTHER): Payer: Commercial Managed Care - HMO

## 2021-12-02 DIAGNOSIS — E291 Testicular hypofunction: Secondary | ICD-10-CM | POA: Diagnosis not present

## 2021-12-02 MED ORDER — TESTOSTERONE CYPIONATE 200 MG/ML IM SOLN
200.0000 mg | Freq: Once | INTRAMUSCULAR | Status: AC
  Administered 2021-12-02: 200 mg via INTRAMUSCULAR

## 2021-12-23 ENCOUNTER — Ambulatory Visit (INDEPENDENT_AMBULATORY_CARE_PROVIDER_SITE_OTHER): Payer: Commercial Managed Care - HMO | Admitting: Medical

## 2021-12-23 ENCOUNTER — Other Ambulatory Visit: Payer: Commercial Managed Care - HMO

## 2021-12-23 VITALS — BP 110/70 | HR 82 | Temp 98.2°F | Wt 183.6 lb

## 2021-12-23 DIAGNOSIS — R7989 Other specified abnormal findings of blood chemistry: Secondary | ICD-10-CM

## 2021-12-23 DIAGNOSIS — I10 Essential (primary) hypertension: Secondary | ICD-10-CM | POA: Diagnosis not present

## 2021-12-23 DIAGNOSIS — M25561 Pain in right knee: Secondary | ICD-10-CM

## 2021-12-23 DIAGNOSIS — N400 Enlarged prostate without lower urinary tract symptoms: Secondary | ICD-10-CM

## 2021-12-23 DIAGNOSIS — N183 Chronic kidney disease, stage 3 unspecified: Secondary | ICD-10-CM

## 2021-12-23 DIAGNOSIS — D631 Anemia in chronic kidney disease: Secondary | ICD-10-CM

## 2021-12-23 DIAGNOSIS — Z79899 Other long term (current) drug therapy: Secondary | ICD-10-CM

## 2021-12-23 DIAGNOSIS — N189 Chronic kidney disease, unspecified: Secondary | ICD-10-CM

## 2021-12-23 DIAGNOSIS — N281 Cyst of kidney, acquired: Secondary | ICD-10-CM | POA: Insufficient documentation

## 2021-12-23 MED ORDER — TESTOSTERONE CYPIONATE 200 MG/ML IM SOLN
200.0000 mg | Freq: Once | INTRAMUSCULAR | Status: AC
  Administered 2021-12-23: 200 mg via INTRAMUSCULAR

## 2021-12-23 NOTE — Assessment & Plan Note (Signed)
Most recent hemoglobin 11/2021 normal

## 2021-12-23 NOTE — Assessment & Plan Note (Signed)
Controlled on current therapy, Irbesartan '150mg'$  daily, Amlodipine '10mg'$  daily,

## 2021-12-23 NOTE — Assessment & Plan Note (Signed)
Continue to monitor PSA and for symptoms yearly, on testosterone therapy

## 2021-12-23 NOTE — Assessment & Plan Note (Signed)
I reviewed his 07/2021 nephrology notes . His labs 11/2021 are stable.   Continue avoidance of NSAIDs, f/u with nephrology in July as planned

## 2021-12-23 NOTE — Progress Notes (Signed)
Subjective:  Shawn Pena is a 56 y.o. male who presents for Chief Complaint  Patient presents with   Knee Pain    Knee pain- right knee x 2 weeks. Using biofreeze and cbd no relieve     Patient Care Team: Maraya Gwilliam, [name] as PCP - General (Family Medicine) Sees dentist Sees eye doctor [name], nephrology [name], GI  Here for med check on testosterone.   Still using injections q21 days.  Last injection about 3 weeks ago, due now.   No concerns or issues with therapy.     Having some knee pain .  Right knee x 2 weeks.  No injury or trauma.  Hurts to be straight or to be bent up.   No swelling.  No warmth.   Not down on knee doing work.    Having some pain of the left foot in the bottom of the foot.  1 particular tender spot.  No injury or trauma.  No other aggravating or relieving factors.    No other c/o.  Past Medical History:  Diagnosis Date   Erectile dysfunction    Genital herpes    Glaucoma    Hyperlipidemia    Hypertension    Hypogonadism male    Low testosterone    Current Outpatient Medications on File Prior to Visit  Medication Sig Dispense Refill   acetaminophen (TYLENOL) 325 MG tablet Take 650 mg by mouth every 6 (six) hours as needed.     albuterol (VENTOLIN HFA) 108 (90 Base) MCG/ACT inhaler TAKE 2 PUFFS BY MOUTH EVERY 6 HOURS AS NEEDED FOR WHEEZE OR SHORTNESS OF BREATH 8.5 each 0   amLODipine (NORVASC) 10 MG tablet Take 1 tablet (10 mg total) by mouth daily. 90 tablet 3   brimonidine-timolol (COMBIGAN) 0.2-0.5 % ophthalmic solution Place 2 drops into both eyes every 12 (twelve) hours.     clindamycin-benzoyl peroxide (BENZACLIN) gel Apply topically 2 (two) times daily. 25 g 0   irbesartan (AVAPRO) 150 MG tablet Take 1 tablet (150 mg total) by mouth daily. 90 tablet 3   pravastatin (PRAVACHOL) 20 MG tablet Take 1 tablet (20 mg total) by mouth daily. 90 tablet 3   sildenafil (REVATIO) 20 MG tablet TAKE 2 TABLETS BY MOUTH ONCE DAILY AS  NEEDED PRIOR  TO  SEXUAL  ACTIVITY 60 tablet 4   testosterone cypionate (DEPOTESTOSTERONE CYPIONATE) 200 MG/ML injection Inject 1 mL (200 mg total) into the muscle every 21 ( twenty-one) days. 1 mL 1   valACYclovir (VALTREX) 1000 MG tablet TAKE 2 TABLETS BY MOUTH TWICE A DAY FOR 2 DAYS FOR FLARE UP 20 tablet 3   ZIOPTAN 0.0015 % SOLN Apply 1 drop to eye at bedtime.     meclizine (ANTIVERT) 25 MG tablet TAKE 1 TABLET BY MOUTH TWICE A DAY (Patient not taking: Reported on 12/23/2021) 30 tablet 0   No current facility-administered medications on file prior to visit.     The following portions of the patient's history were reviewed and updated as appropriate: allergies, current medications, past family history, past medical history, past social history, past surgical history and problem list.  ROS Otherwise as in subjective above  Objective: BP 110/70   Pulse 82   Temp 98.2 F (36.8 C)   Wt 183 lb 9.6 oz (83.3 kg)   BMI 23.57 kg/m   Wt Readings from Last 3 Encounters:  12/23/21 183 lb 9.6 oz (83.3 kg)  11/20/21 175 lb 3.2 oz (79.5  kg)  05/27/21 186 lb (84.4 kg)   BP Readings from Last 3 Encounters:  12/23/21 110/70  11/20/21 120/78  05/27/21 130/88    General appearance: alert, no distress, well developed, well nourished MSK: Tender over the lateral right knee, LCL tendnerss, no swelling, no laxity, normal knee ROM, left volar foot with planar fascia tendnerss, no swelling, no other deformity, rest of leg exam unremarkable Pulses: 2+ radial pulses, 2+ pedal pulses, normal cap refill Ext: no edema   Assessment: Encounter Diagnoses  Name Primary?   Low testosterone Yes   High risk medication use    Anemia of renal disease    Essential hypertension, benign    Stage 3 chronic kidney disease, unspecified whether stage 3a or 3b CKD (HCC)    Renal cyst    Benign prostatic hyperplasia without lower urinary tract symptoms      Plan: Problem List Items Addressed This Visit      Low testosterone - Primary    Lab today, continue current therapy '200mg'$  q21 days.         Relevant Medications   testosterone cypionate (DEPOTESTOSTERONE CYPIONATE) injection 200 mg (Start on 12/23/2021  1:45 PM)   Other Relevant Orders   Testosterone   High risk medication use   Relevant Orders   Testosterone   Essential hypertension, benign    Controlled on current therapy, Irbesartan '150mg'$  daily, Amlodipine '10mg'$  daily,       Stage 3 chronic kidney disease ([place])    I reviewed his 07/2021 nephrology notes . His labs 11/2021 are stable.   Continue avoidance of NSAIDs, f/u with nephrology in July as planned      Anemia of renal disease    Most recent hemoglobin 11/2021 normal      Renal cyst    Noted on prior ultrasound in 2020. Follow up with nephrology 01/2022 as planned      Benign prostatic hyperplasia without lower urinary tract symptoms    Continue to monitor PSA and for symptoms yearly, on testosterone therapy       Patient Instructions  Right knee pain Likely inflammation vs bone spur or arthritis of the lateral knee  You can use over the counter Tylenol once or twice daily as needed for pain You can use topical over the counter Voltaren/Diclofenac gel twice daily over the knee when painful Stretch daily Be mindful not to stand prolonged on either leg, adjust your stance throughout the day     Plantar fascitis Every morning try doing a tennis ball massage with feet on the floor and towel stretch behind the ball of the foot to stretch the plantar fascia Consider ordering plantar fascitis night time 90 degree splints online, through [employer] for example.   They are typically about $25 each Avoid going barefoot since your feet flatten out without good arch support I recommend you use arch support shoe INSERTS.   This will help support the plantar fascia and arches You can use over the counter pain reliever for worse pain    Plantar Fasciitis Plantar fasciitis  is a painful foot condition that affects the heel. It occurs when the band of tissue that connects the toes to the heel bone (plantar fascia) becomes irritated. This can happen after exercising too much or doing other repetitive activities (overuse injury). The pain from plantar fasciitis can range from mild irritation to severe pain that makes it difficult for you to walk or move. The pain is usually worse in the morning or  after you have been sitting or lying down for a while. What are the causes? This condition may be caused by: Standing for long periods of time. Wearing shoes that do not fit. Doing high-impact activities, including running, aerobics, and ballet. Being overweight. Having an abnormal way of walking (gait). Having tight calf muscles. Having high arches in your feet. Starting a new athletic activity.  What are the signs or symptoms? The main symptom of this condition is heel pain. Other symptoms include: Pain that gets worse after activity or exercise. Pain that is worse in the morning or after resting. Pain that goes away after you walk for a few minutes.  How is this diagnosed? This condition may be diagnosed based on your signs and symptoms. Your health care provider will also do a physical exam to check for: A tender area on the bottom of your foot. A high arch in your foot. Pain when you move your foot. Difficulty moving your foot.  You may also need to have imaging studies to confirm the diagnosis. These can include: X-rays. Ultrasound. MRI.  How is this treated? Treatment for plantar fasciitis depends on the severity of the condition. Your treatment may include: Rest, ice, and over-the-counter pain medicines to manage your pain. Exercises to stretch your calves and your plantar fascia. A splint that holds your foot in a stretched, upward position while you sleep (night splint). Physical therapy to relieve symptoms and prevent problems in the  future. Cortisone injections to relieve severe pain. Extracorporeal shock wave therapy (ESWT) to stimulate damaged plantar fascia with electrical impulses. It is often used as a last resort before surgery. Surgery, if other treatments have not worked after 12 months.  Follow these instructions at home: Take medicines only as directed by your health care provider. Avoid activities that cause pain. Roll the bottom of your foot over a bag of ice or a bottle of cold water. Do this for 20 minutes, 3-4 times a day. Perform simple stretches as directed by your health care provider. Try wearing athletic shoes with air-sole or gel-sole cushions or soft shoe inserts. Wear a night splint while sleeping, if directed by your health care provider. Keep all follow-up appointments with your health care provider. How is this prevented? Do not perform exercises or activities that cause heel pain. Consider finding low-impact activities if you continue to have problems. Lose weight if you need to. The best way to prevent plantar fasciitis is to avoid the activities that aggravate your plantar fascia. Contact a health care provider if: Your symptoms do not go away after treatment with home care measures. Your pain gets worse. Your pain affects your ability to move or do your daily activities. This information is not intended to replace advice given to you by your health care provider. Make sure you discuss any questions you have with your health care provider. Document Released: 03/25/2001 Document Revised: 12/03/2015 Document Reviewed: 05/10/2014 Elsevier Interactive Patient Education  2018 [employer].       Follow up: pending labs, CPX in September

## 2021-12-23 NOTE — Assessment & Plan Note (Addendum)
Lab today, continue current therapy '200mg'$  q21 days.

## 2021-12-23 NOTE — Assessment & Plan Note (Signed)
Noted on prior ultrasound in 2020. Follow up with nephrology 01/2022 as planned

## 2021-12-23 NOTE — Patient Instructions (Signed)
Right knee pain Likely inflammation vs bone spur or arthritis of the lateral knee  You can use over the counter Tylenol once or twice daily as needed for pain You can use topical over the counter Voltaren/Diclofenac gel twice daily over the knee when painful Stretch daily Be mindful not to stand prolonged on either leg, adjust your stance throughout the day     Plantar fascitis Every morning try doing a tennis ball massage with feet on the floor and towel stretch behind the ball of the foot to stretch the plantar fascia Consider ordering plantar fascitis night time 90 degree splints online, through [employer] for example.   They are typically about $25 each Avoid going barefoot since your feet flatten out without good arch support I recommend you use arch support shoe INSERTS.   This will help support the plantar fascia and arches You can use over the counter pain reliever for worse pain    Plantar Fasciitis Plantar fasciitis is a painful foot condition that affects the heel. It occurs when the band of tissue that connects the toes to the heel bone (plantar fascia) becomes irritated. This can happen after exercising too much or doing other repetitive activities (overuse injury). The pain from plantar fasciitis can range from mild irritation to severe pain that makes it difficult for you to walk or move. The pain is usually worse in the morning or after you have been sitting or lying down for a while. What are the causes? This condition may be caused by: Standing for long periods of time. Wearing shoes that do not fit. Doing high-impact activities, including running, aerobics, and ballet. Being overweight. Having an abnormal way of walking (gait). Having tight calf muscles. Having high arches in your feet. Starting a new athletic activity.  What are the signs or symptoms? The main symptom of this condition is heel pain. Other symptoms include: Pain that gets worse after activity or  exercise. Pain that is worse in the morning or after resting. Pain that goes away after you walk for a few minutes.  How is this diagnosed? This condition may be diagnosed based on your signs and symptoms. Your health care provider will also do a physical exam to check for: A tender area on the bottom of your foot. A high arch in your foot. Pain when you move your foot. Difficulty moving your foot.  You may also need to have imaging studies to confirm the diagnosis. These can include: X-rays. Ultrasound. MRI.  How is this treated? Treatment for plantar fasciitis depends on the severity of the condition. Your treatment may include: Rest, ice, and over-the-counter pain medicines to manage your pain. Exercises to stretch your calves and your plantar fascia. A splint that holds your foot in a stretched, upward position while you sleep (night splint). Physical therapy to relieve symptoms and prevent problems in the future. Cortisone injections to relieve severe pain. Extracorporeal shock wave therapy (ESWT) to stimulate damaged plantar fascia with electrical impulses. It is often used as a last resort before surgery. Surgery, if other treatments have not worked after 12 months.  Follow these instructions at home: Take medicines only as directed by your health care provider. Avoid activities that cause pain. Roll the bottom of your foot over a bag of ice or a bottle of cold water. Do this for 20 minutes, 3-4 times a day. Perform simple stretches as directed by your health care provider. Try wearing athletic shoes with air-sole or gel-sole cushions or  soft shoe inserts. Wear a night splint while sleeping, if directed by your health care provider. Keep all follow-up appointments with your health care provider. How is this prevented? Do not perform exercises or activities that cause heel pain. Consider finding low-impact activities if you continue to have problems. Lose weight if you need  to. The best way to prevent plantar fasciitis is to avoid the activities that aggravate your plantar fascia. Contact a health care provider if: Your symptoms do not go away after treatment with home care measures. Your pain gets worse. Your pain affects your ability to move or do your daily activities. This information is not intended to replace advice given to you by your health care provider. Make sure you discuss any questions you have with your health care provider. Document Released: 03/25/2001 Document Revised: 12/03/2015 Document Reviewed: 05/10/2014 Elsevier Interactive Patient Education  [employer].

## 2021-12-24 LAB — TESTOSTERONE: Testosterone: 197 ng/dL — ABNORMAL LOW (ref 264–916)

## 2022-01-04 ENCOUNTER — Other Ambulatory Visit: Payer: Self-pay | Admitting: Medical

## 2022-01-19 ENCOUNTER — Other Ambulatory Visit: Payer: Self-pay | Admitting: Medical

## 2022-01-20 ENCOUNTER — Other Ambulatory Visit (INDEPENDENT_AMBULATORY_CARE_PROVIDER_SITE_OTHER): Payer: Commercial Managed Care - HMO

## 2022-01-20 DIAGNOSIS — R7989 Other specified abnormal findings of blood chemistry: Secondary | ICD-10-CM

## 2022-01-20 MED ORDER — TESTOSTERONE CYPIONATE 200 MG/ML IM SOLN
200.0000 mg | Freq: Once | INTRAMUSCULAR | Status: AC
  Administered 2022-01-20: 200 mg via INTRAMUSCULAR

## 2022-01-20 NOTE — Telephone Encounter (Signed)
Is this okay to refill? 

## 2022-01-21 ENCOUNTER — Telehealth: Payer: Self-pay | Admitting: Internal Medicine

## 2022-01-21 NOTE — Telephone Encounter (Signed)
P.A has been done for Sildenafil '20mg'$  tablets

## 2022-01-22 NOTE — Telephone Encounter (Signed)
Sildenafil 40m is denied  due to patient not having Pulmonary Arterial Hypertension. Sildenafil is considered medically necessary in adults when 1-4 things are met.  1- Dx of PH is documented by right heart cath or echo 2- Prescriber of therapy should be prescribed in coordination with pulmonology, cardio or rheumatology 3- Treatment of PAH in WHO group 1 4- will not be used in combination with a guanylate cyclase stimulator   Please advise next step

## 2022-01-22 NOTE — Telephone Encounter (Signed)
Pt would like generic sent in to see if that is covered

## 2022-01-23 ENCOUNTER — Other Ambulatory Visit: Payer: Self-pay | Admitting: Medical

## 2022-01-23 ENCOUNTER — Telehealth: Payer: Self-pay | Admitting: Medical

## 2022-01-23 MED ORDER — SILDENAFIL CITRATE 20 MG PO TABS: 40.0000 mg | ORAL_TABLET | Freq: Every day | ORAL | 0 refills | Status: DC | PRN

## 2022-01-23 NOTE — Telephone Encounter (Signed)
Pt was notified.  

## 2022-01-23 NOTE — Telephone Encounter (Signed)
Generic Viagra sent, take 2 tablets as needed for sexual activity.  No more than 2 tablets in a given day.

## 2022-01-23 NOTE — Telephone Encounter (Signed)
Per [facility] pt will pay $11-22 for #50 tablets.  Pt will use [facility].  [place]- [id number]  PCN- [id number]  Member ID [id number]  Pt notified as I have sent him a text with this info

## 2022-01-23 NOTE — Telephone Encounter (Signed)
Looks like insuranace has denied Generic Sildenafil '20mg'$ . Please advise

## 2022-01-23 NOTE — Addendum Note (Signed)
Addended by: Minette Headland A on: 01/23/2022 11:09 AM   Modules accepted: Orders

## 2022-01-24 IMAGING — DX DG CHEST 2V
2 series · 2 of 2 positions shown · non-contrast
Comparison: PA and lateral chest 05/10/2012.

CLINICAL DATA: Cough and wheezing.

EXAM:
CHEST - 2 VIEW

[dg chest 2 view (1 of 2)]
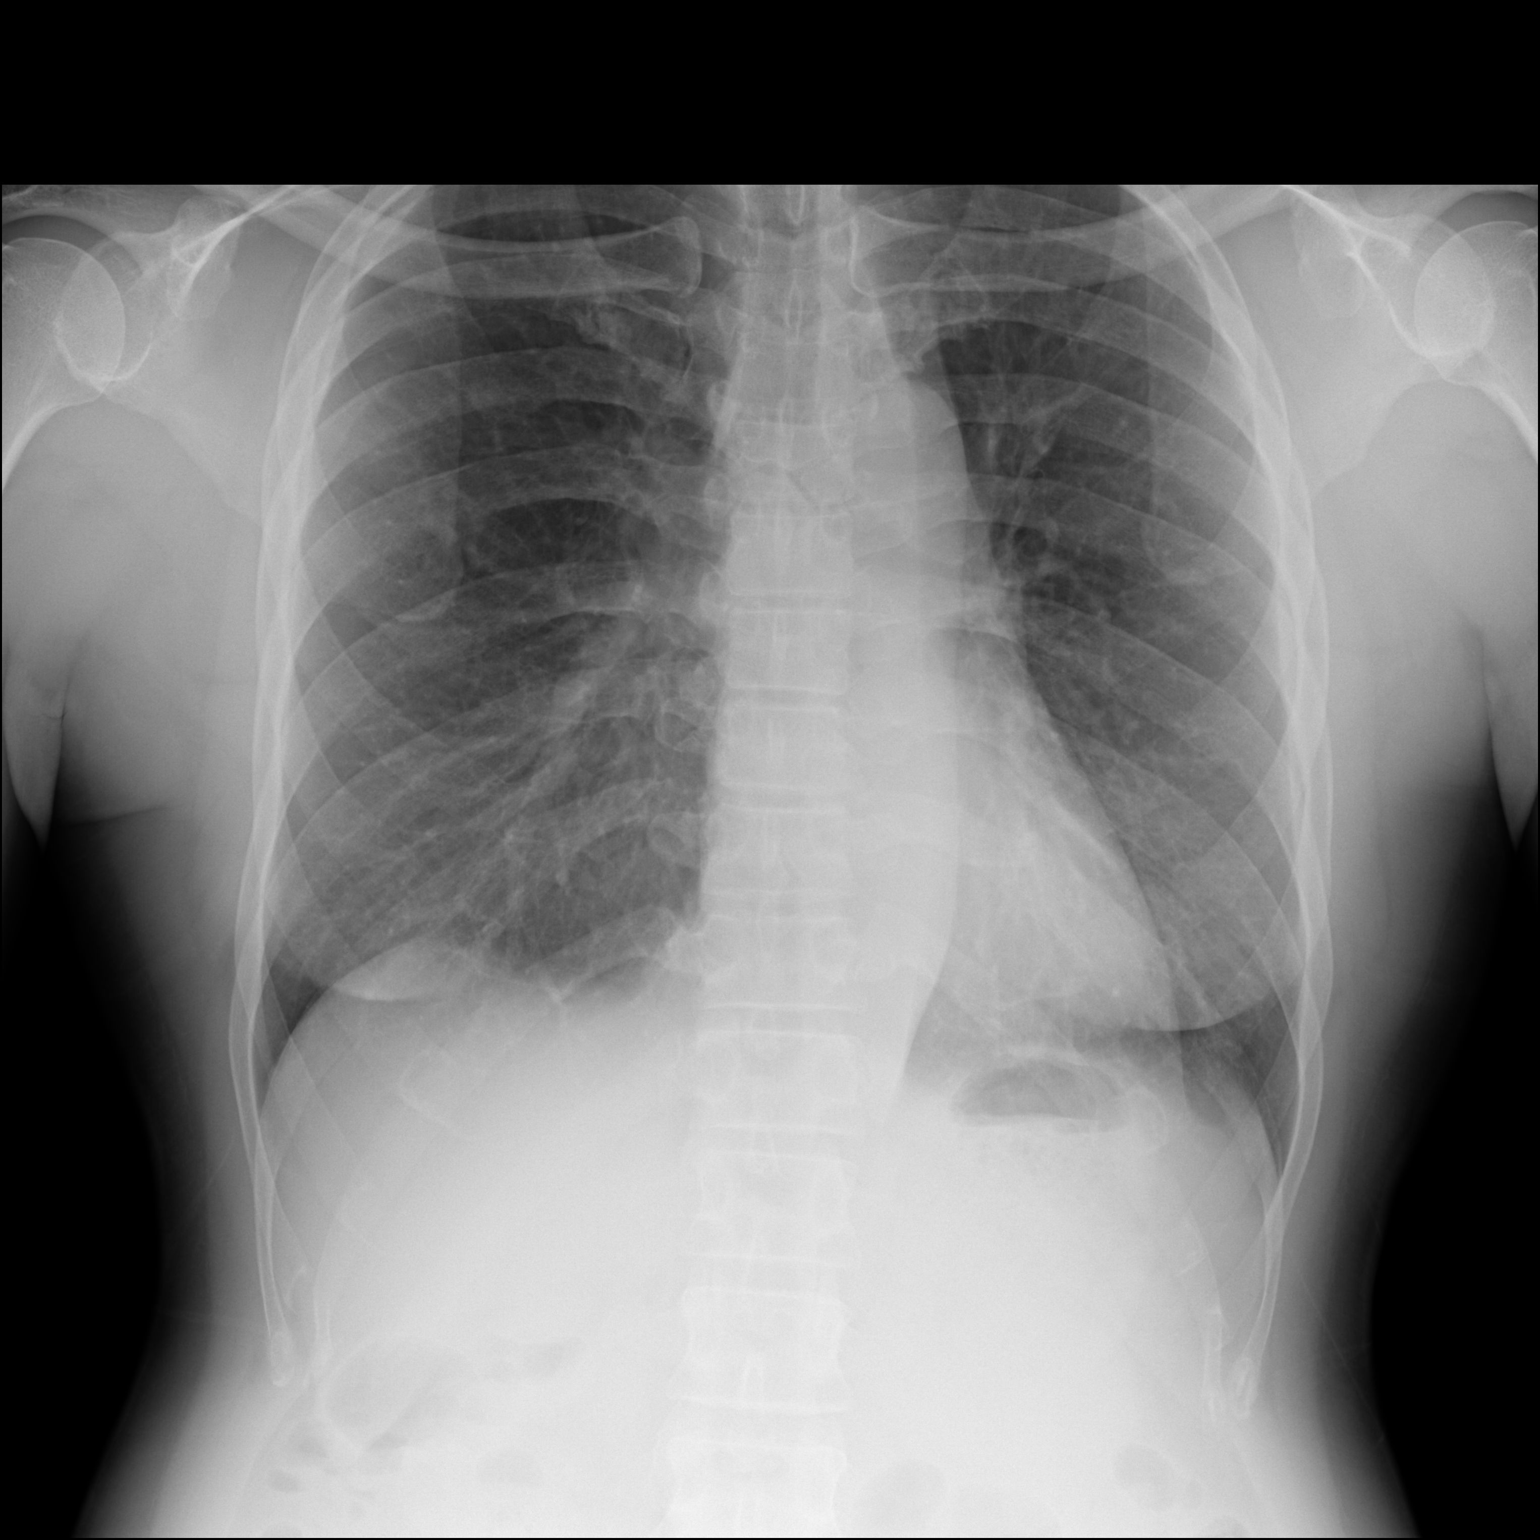

[dg chest 2 view (2 of 2)]
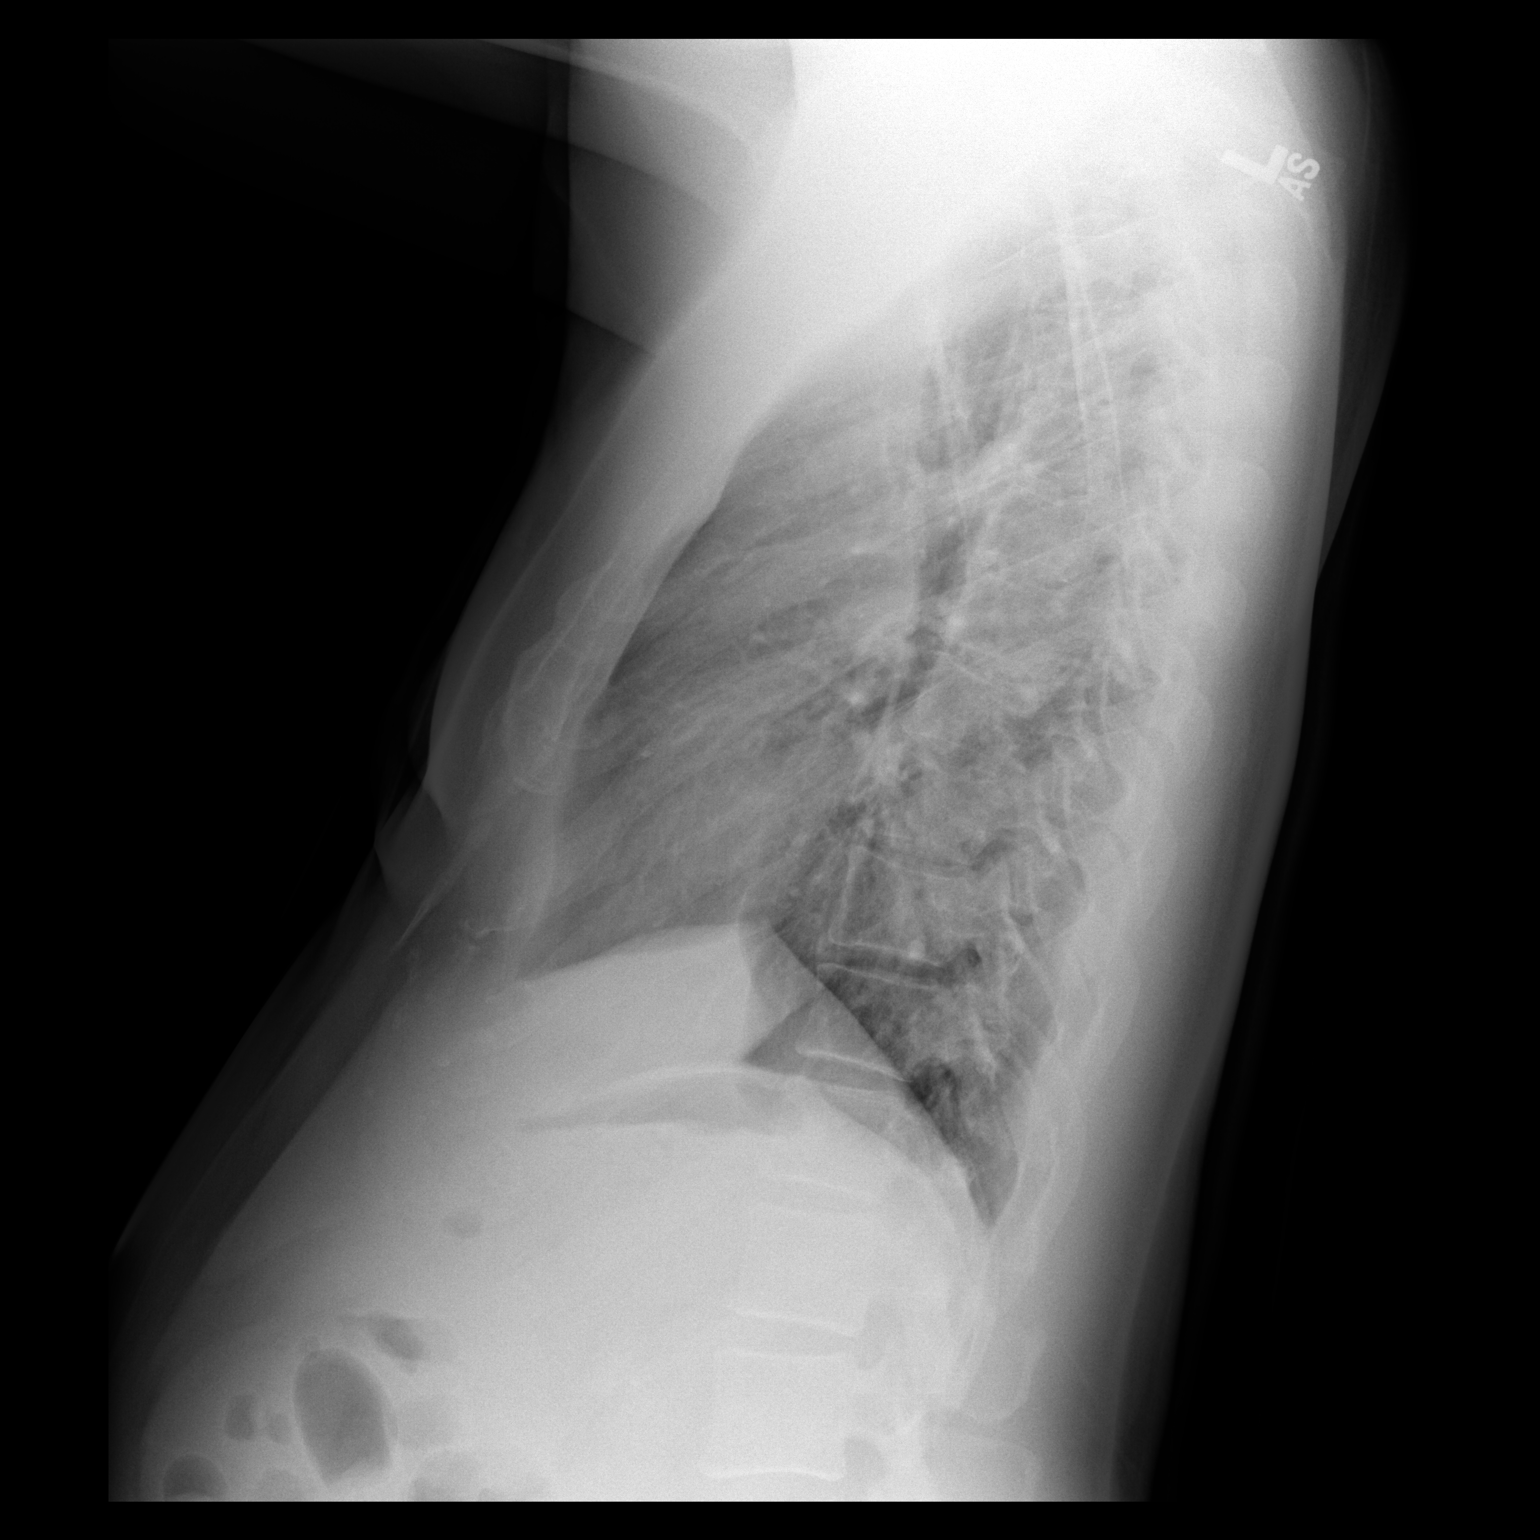

[2 of 2 positions shown; findings below may reference images not displayed]

FINDINGS: The lungs are clear. Mild eventration of the right hemidiaphragm is
unchanged. No pneumothorax or pleural fluid. No acute or focal bony
abnormality.
IMPRESSION: No acute disease.

## 2022-02-10 ENCOUNTER — Other Ambulatory Visit (INDEPENDENT_AMBULATORY_CARE_PROVIDER_SITE_OTHER): Payer: Commercial Managed Care - HMO

## 2022-02-10 DIAGNOSIS — R7989 Other specified abnormal findings of blood chemistry: Secondary | ICD-10-CM | POA: Diagnosis not present

## 2022-02-10 MED ORDER — TESTOSTERONE CYPIONATE 200 MG/ML IM SOLN
200.0000 mg | Freq: Once | INTRAMUSCULAR | Status: AC
  Administered 2022-02-10: 200 mg via INTRAMUSCULAR

## 2022-02-18 IMAGING — DX DG CHEST 2V
2 series · 2 of 2 positions shown · non-contrast
Comparison: December 17, 2020.

CLINICAL DATA: Fever, cough.

EXAM:
CHEST - 2 VIEW

[dg chest 2 view (1 of 2)]
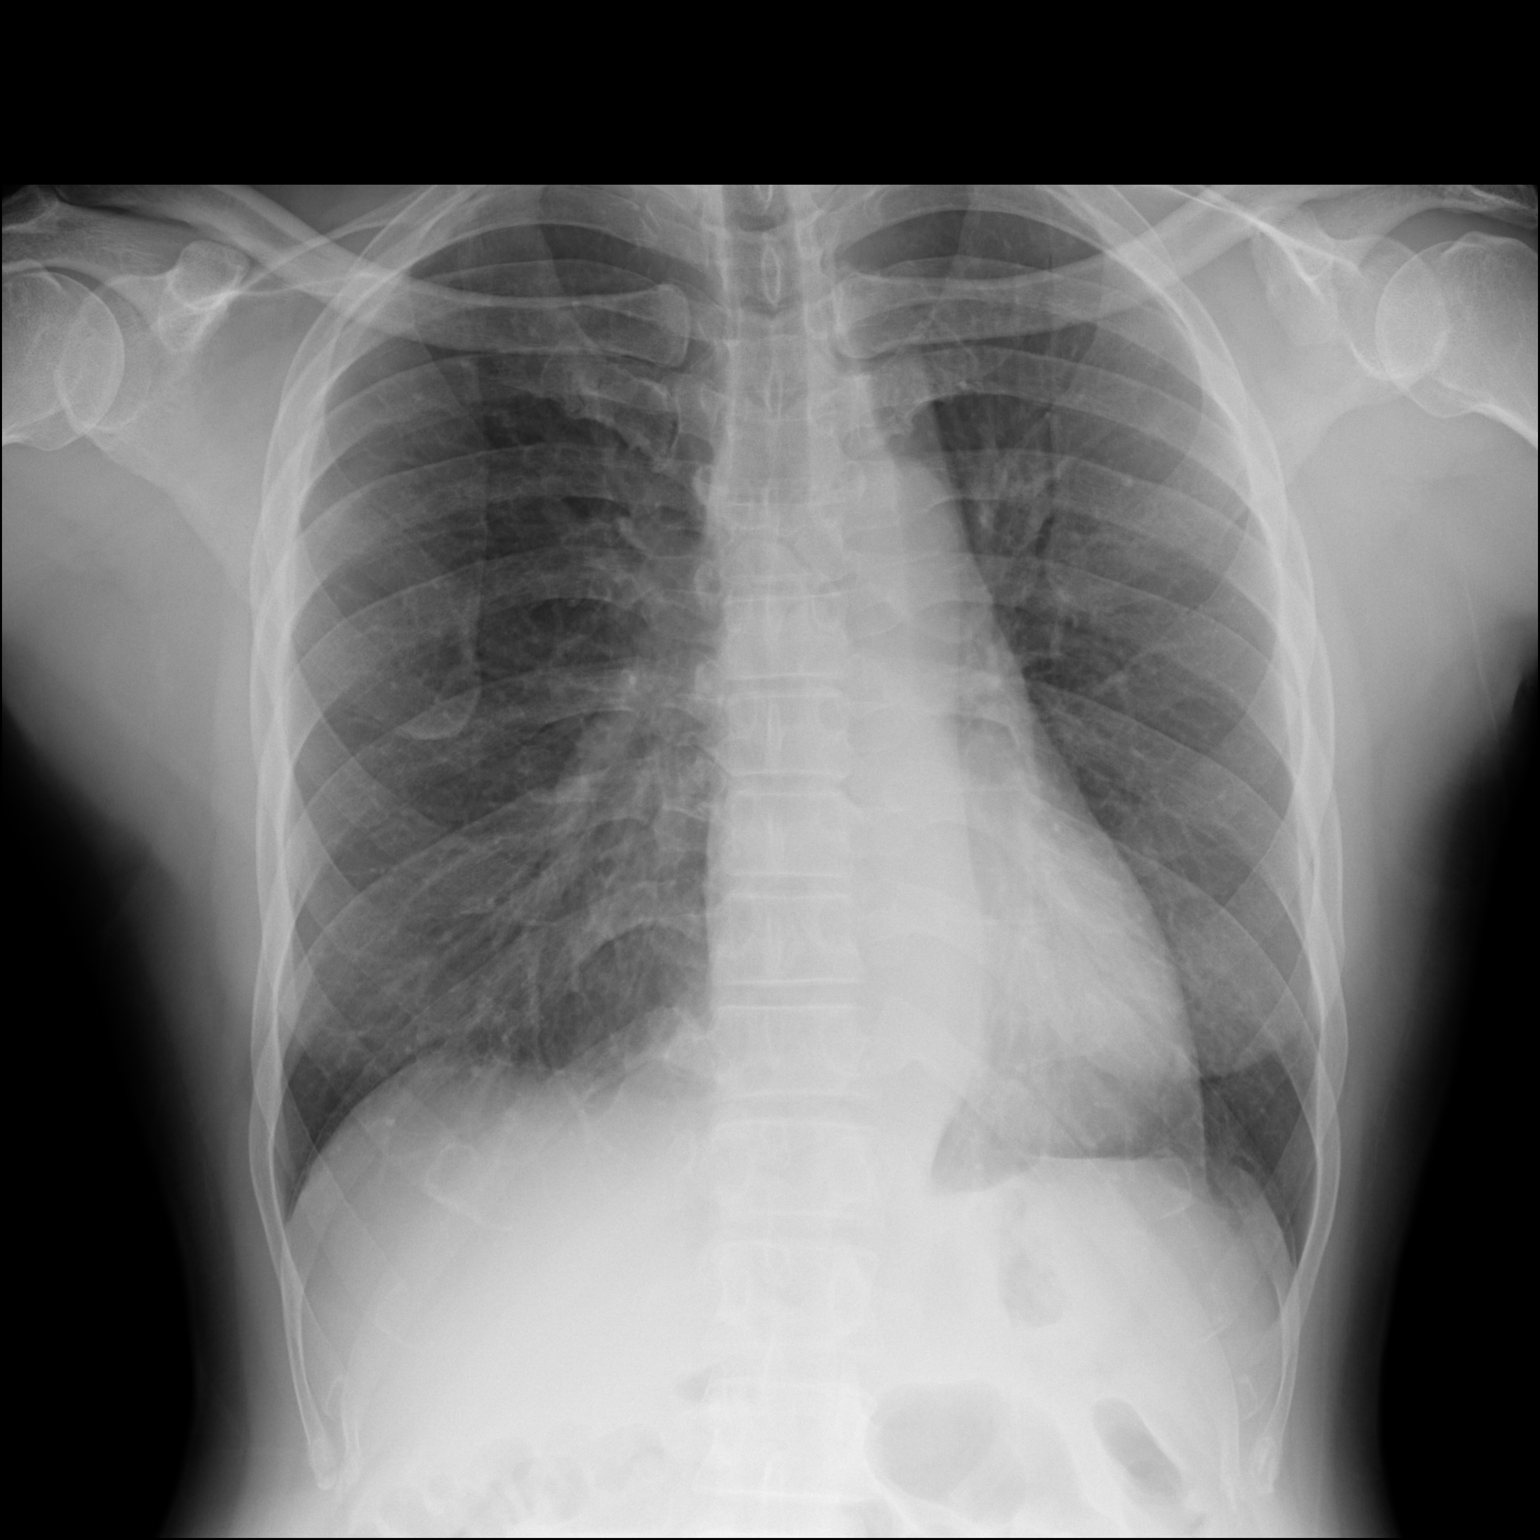

[dg chest 2 view (2 of 2)]
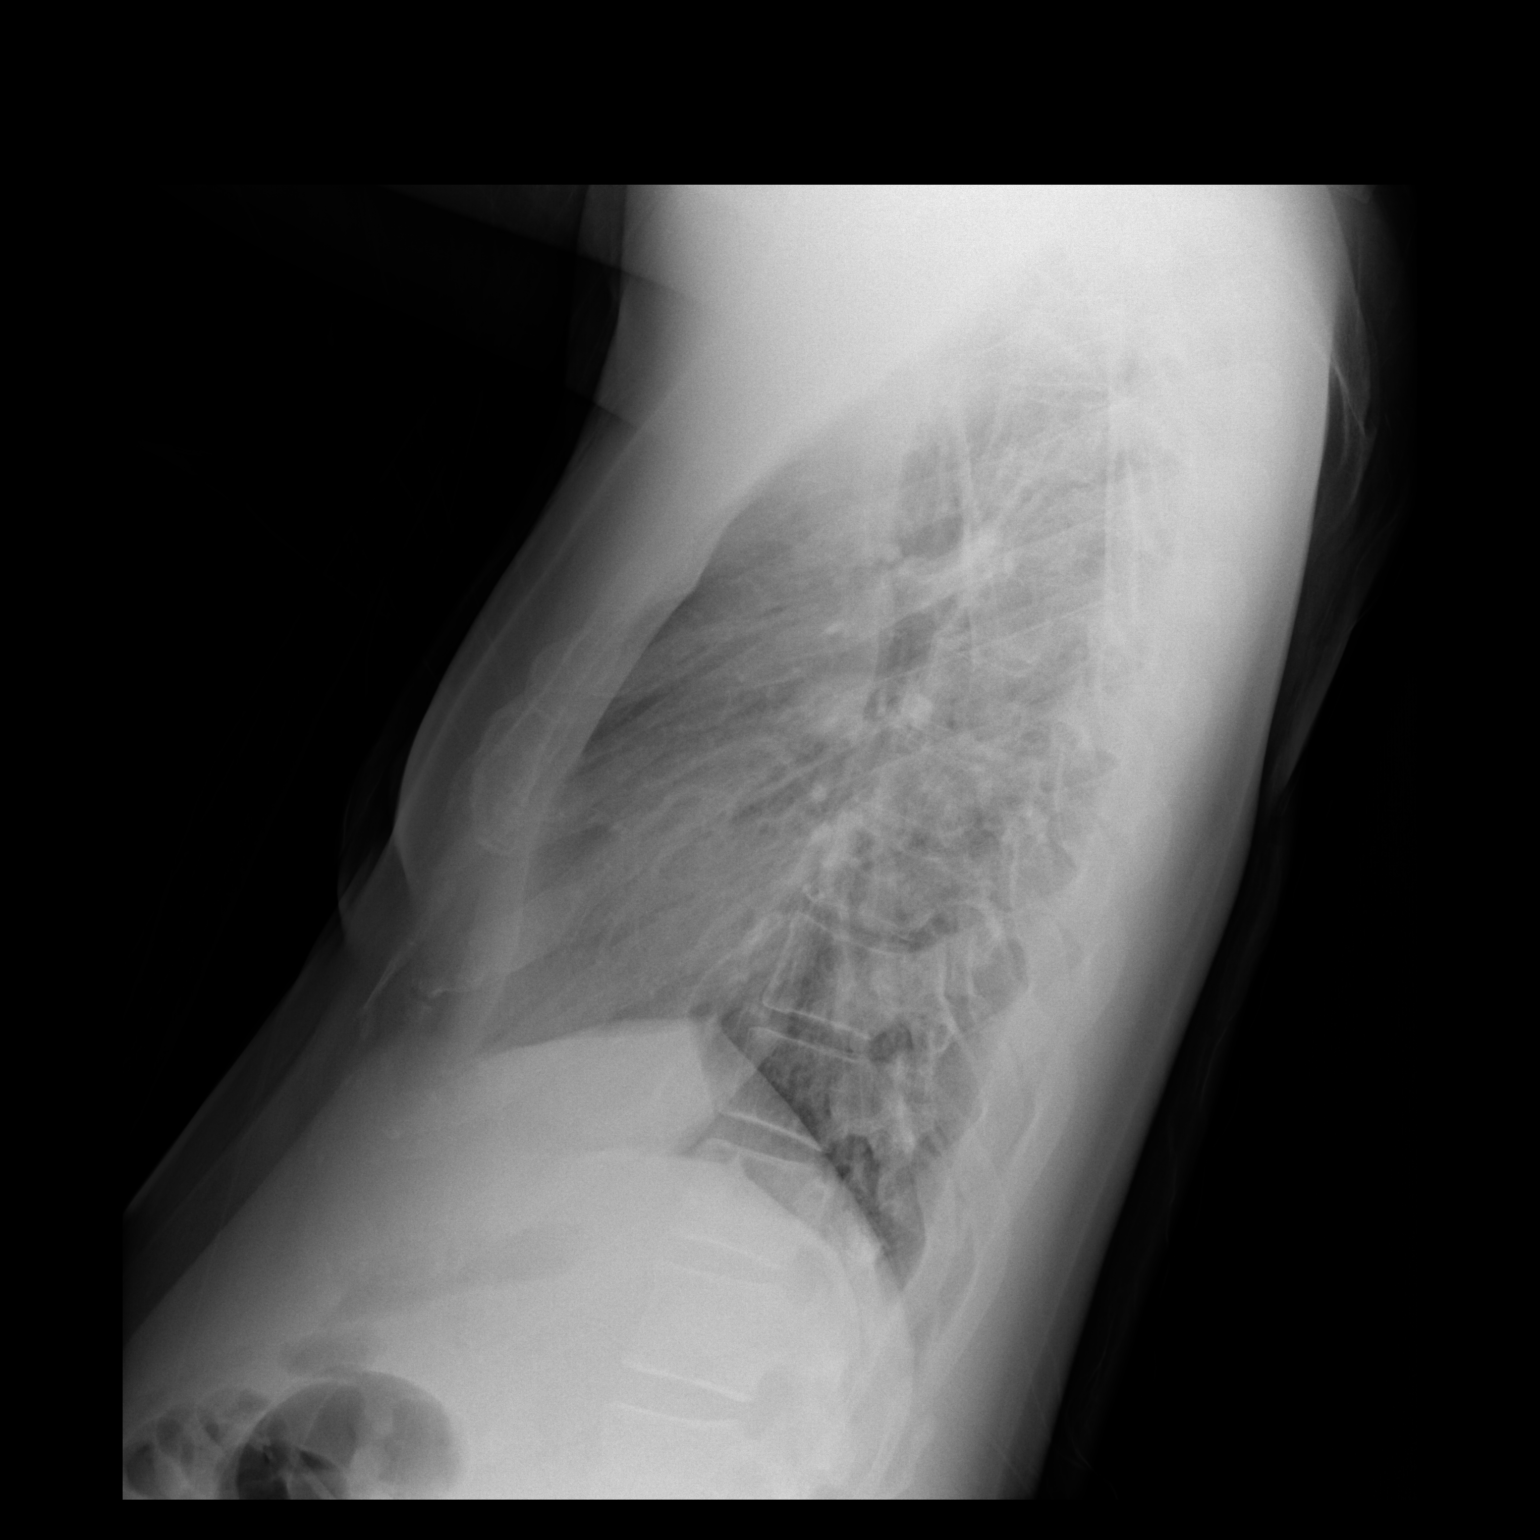

[2 of 2 positions shown; findings below may reference images not displayed]

FINDINGS: The heart size and mediastinal contours are within normal limits.
Both lungs are clear. The visualized skeletal structures are
unremarkable.
IMPRESSION: No active cardiopulmonary disease.

## 2022-03-03 ENCOUNTER — Other Ambulatory Visit (INDEPENDENT_AMBULATORY_CARE_PROVIDER_SITE_OTHER): Payer: Commercial Managed Care - HMO

## 2022-03-03 DIAGNOSIS — R7989 Other specified abnormal findings of blood chemistry: Secondary | ICD-10-CM | POA: Diagnosis not present

## 2022-03-03 MED ORDER — TESTOSTERONE CYPIONATE 200 MG/ML IM SOLN
200.0000 mg | INTRAMUSCULAR | Status: DC
  Administered 2022-03-03: 200 mg via INTRAMUSCULAR

## 2022-03-19 ENCOUNTER — Encounter: Payer: Self-pay | Admitting: Internal Medicine

## 2022-04-01 ENCOUNTER — Other Ambulatory Visit: Payer: Self-pay | Admitting: Medical

## 2022-04-02 ENCOUNTER — Other Ambulatory Visit: Payer: Self-pay | Admitting: Medical

## 2022-04-07 ENCOUNTER — Ambulatory Visit: Payer: Commercial Managed Care - HMO | Admitting: Medical

## 2022-04-07 VITALS — BP 120/70 | HR 65 | Ht 75.0 in | Wt 177.8 lb

## 2022-04-07 DIAGNOSIS — I10 Essential (primary) hypertension: Secondary | ICD-10-CM

## 2022-04-07 DIAGNOSIS — Z131 Encounter for screening for diabetes mellitus: Secondary | ICD-10-CM

## 2022-04-07 DIAGNOSIS — E785 Hyperlipidemia, unspecified: Secondary | ICD-10-CM

## 2022-04-07 DIAGNOSIS — Z125 Encounter for screening for malignant neoplasm of prostate: Secondary | ICD-10-CM

## 2022-04-07 DIAGNOSIS — Z0000 Encounter for general adult medical examination without abnormal findings: Secondary | ICD-10-CM

## 2022-04-07 DIAGNOSIS — D631 Anemia in chronic kidney disease: Secondary | ICD-10-CM

## 2022-04-07 DIAGNOSIS — D369 Benign neoplasm, unspecified site: Secondary | ICD-10-CM

## 2022-04-07 DIAGNOSIS — N183 Chronic kidney disease, stage 3 unspecified: Secondary | ICD-10-CM

## 2022-04-07 DIAGNOSIS — R7989 Other specified abnormal findings of blood chemistry: Secondary | ICD-10-CM | POA: Diagnosis not present

## 2022-04-07 DIAGNOSIS — E291 Testicular hypofunction: Secondary | ICD-10-CM

## 2022-04-07 DIAGNOSIS — Z7185 Encounter for immunization safety counseling: Secondary | ICD-10-CM

## 2022-04-07 DIAGNOSIS — Z79899 Other long term (current) drug therapy: Secondary | ICD-10-CM

## 2022-04-07 DIAGNOSIS — Z87891 Personal history of nicotine dependence: Secondary | ICD-10-CM

## 2022-04-07 DIAGNOSIS — Z23 Encounter for immunization: Secondary | ICD-10-CM

## 2022-04-07 DIAGNOSIS — N400 Enlarged prostate without lower urinary tract symptoms: Secondary | ICD-10-CM

## 2022-04-07 DIAGNOSIS — N529 Male erectile dysfunction, unspecified: Secondary | ICD-10-CM

## 2022-04-07 DIAGNOSIS — N189 Chronic kidney disease, unspecified: Secondary | ICD-10-CM

## 2022-04-07 MED ORDER — TESTOSTERONE CYPIONATE 200 MG/ML IM SOLN
100.0000 mg | Freq: Once | INTRAMUSCULAR | Status: AC
  Administered 2022-04-07: 100 mg via INTRAMUSCULAR

## 2022-04-07 NOTE — Progress Notes (Signed)
Left message for Amber at Dr. Hollie Salk office [phone] ext [phone]

## 2022-04-07 NOTE — Progress Notes (Signed)
Subjective:   HPI  Shawn Pena is a 56 y.o. male who presents for Chief Complaint  Patient presents with   fasting cpe    Fasting cpe, does he need to see a rheumatology for anything? He got a call about releasing records    Patient Care Team: [name], [name] as PCP - General (Family Medicine) Sees dentist Sees eye doctor [name] , formerly [name], nephrology [name]   Concerns:      Hypertension - compliant with medications, no c/o.  No edema  Hyperlipidemia - compliant with medicaiton without c/o  Low testosterone - coming in every 3 weeks for TST injections.  Doing fine on this  No recent genital herpes flare  Sees eye doctor for glaucoma  ED - uses sildenafil prn without any trouble   Reviewed their medical, surgical, family, social, medication, and allergy history and updated chart as appropriate.  Past Medical History:  Diagnosis Date   Erectile dysfunction    Genital herpes    Glaucoma    Hyperlipidemia    Hypertension    Hypogonadism male    Low testosterone     Past Surgical History:  Procedure Laterality Date   COLONOSCOPY  10/2017   Tubular Adenoma, repeat 5 years, [name]   THROAT SURGERY     vocal cord polyp   WISDOM TOOTH EXTRACTION      Family History  Problem Relation Age of Onset   Kidney disease Mother    Heart disease Mother    Hypertension Mother    Diabetes Mother    Hypertension Father    Hypertension Sister    Diabetes Sister    Hypertension Brother    Cancer Maternal Grandmother        stomach   Stomach cancer Maternal Grandmother    Cancer Maternal Grandfather        prostate   Prostate cancer Maternal Grandfather    Cancer Paternal Grandmother        ovarian   Ovarian cancer Paternal Grandmother    Stroke Paternal Grandfather    Esophageal cancer Maternal Uncle    Colon cancer Neg Hx    Colon polyps Neg Hx    Rectal cancer Neg Hx      Current Outpatient Medications:     acetaminophen (TYLENOL) 325 MG tablet, Take 650 mg by mouth every 6 (six) hours as needed., Disp: , Rfl:    albuterol (VENTOLIN HFA) 108 (90 Base) MCG/ACT inhaler, TAKE 2 PUFFS BY MOUTH EVERY 6 HOURS AS NEEDED FOR WHEEZE OR SHORTNESS OF BREATH, Disp: 8.5 each, Rfl: 0   amLODipine (NORVASC) 10 MG tablet, Take 1 tablet (10 mg total) by mouth daily., Disp: 90 tablet, Rfl: 3   brimonidine-timolol (COMBIGAN) 0.2-0.5 % ophthalmic solution, Place 2 drops into both eyes every 12 (twelve) hours., Disp: , Rfl:    clindamycin-benzoyl peroxide (BENZACLIN) gel, Apply topically 2 (two) times daily., Disp: 25 g, Rfl: 0   meclizine (ANTIVERT) 25 MG tablet, TAKE 1 TABLET BY MOUTH TWICE A DAY, Disp: 30 tablet, Rfl: 0   pravastatin (PRAVACHOL) 20 MG tablet, Take 1 tablet (20 mg total) by mouth daily., Disp: 90 tablet, Rfl: 3   sildenafil (REVATIO) 20 MG tablet, TAKE 2 TABLETS BY MOUTH ONCE DAILY AS NEEDED PRIOR  TO  SEXUAL  ACTIVITY, Disp: 60 tablet, Rfl: 0   testosterone cypionate (DEPOTESTOSTERONE CYPIONATE) 200 MG/ML injection, INJECT 1 ML (200 MG TOTAL) INTO THE MUSCLE EVERY  28 (TWENTY-EIGHT) DAYS., Disp: 1 mL, Rfl: 1   valACYclovir (VALTREX) 1000 MG tablet, TAKE 2 TABLETS BY MOUTH TWICE A DAY FOR 2 DAYS FOR FLARE UP, Disp: 20 tablet, Rfl: 3   ZIOPTAN 0.0015 % SOLN, Apply 1 drop to eye at bedtime., Disp: , Rfl:    irbesartan (AVAPRO) 150 MG tablet, Take 1 tablet (150 mg total) by mouth daily., Disp: 90 tablet, Rfl: 3   VYZULTA 0.024 % SOLN, Apply 1 drop to eye at bedtime., Disp: , Rfl:   Allergies  Allergen Reactions   Penicillins Rash     Review of Systems Constitutional: -fever, -chills, -sweats, -unexpected weight change, -decreased appetite, -fatigue Allergy: -sneezing, -itching, -congestion Dermatology: -changing moles, --rash, -lumps ENT: -runny nose, -ear pain, -sore throat, -hoarseness, -sinus pain, -teeth pain, - ringing in ears, -hearing loss, -nosebleeds Cardiology: -chest pain,  -palpitations, -swelling, -difficulty breathing when lying flat, -waking up short of breath Respiratory: -cough, -shortness of breath, -difficulty breathing with exercise or exertion, -wheezing, -coughing up blood Gastroenterology: -abdominal pain, -nausea, -vomiting, -diarrhea, -constipation, -blood in stool, -changes in bowel movement, -difficulty swallowing or eating Hematology: -bleeding, -bruising  Musculoskeletal: -joint aches, -muscle aches, -joint swelling, -back pain, -neck pain, -cramping, -changes in gait Ophthalmology: denies vision changes, eye redness, itching, discharge Urology: -burning with urination, -difficulty urinating, -blood in urine, -urinary frequency, -urgency, -incontinence Neurology: -headache, -weakness, -tingling, -numbness, -memory loss, -falls, -dizziness Psychology: -depressed mood, -agitation, -sleep problems Male GU: no testicular mass, pain, no lymph nodes swollen, no swelling, no rash.     04/07/2022    1:50 PM 11/20/2021    1:30 PM 04/01/2021    3:07 PM 01/23/2020    2:09 PM 01/03/2019   11:04 AM  Depression screen PHQ 2/9  Decreased Interest 0 0 0 0 0  Down, Depressed, Hopeless 0 0 0 0 0  PHQ - 2 Score 0 0 0 0 0        Objective:  BP 120/70   Pulse 65   Ht '6\' 3"'$  (1.905 m)   Wt 177 lb 12.8 oz (80.6 kg)   BMI 22.22 kg/m   General appearance: alert, no distress, WD/WN, African American male Skin: unremarkable HEENT: normocephalic, conjunctiva/corneas normal, sclerae anicteric, PERRLA, EOMi Neck: supple, no lymphadenopathy, no thyromegaly, no masses, normal ROM, no bruits Chest: non tender, normal shape and expansion Heart: RRR, normal S1, S2, no murmurs Lungs: CTA bilaterally, no wheezes, rhonchi, or rales Abdomen: +bs, soft, non tender, non distended, no masses, no hepatomegaly, no splenomegaly, no bruits Back: non tender, normal ROM, no scoliosis Musculoskeletal: upper extremities non tender, no obvious deformity, normal ROM throughout,  lower extremities non tender, no obvious deformity, normal ROM throughout Extremities: no edema, no cyanosis, no clubbing Pulses: 2+ symmetric, upper and lower extremities, normal cap refill Neurological: alert, oriented x 3, CN2-12 intact, strength normal upper extremities and lower extremities, sensation normal throughout, DTRs 2+ throughout, no cerebellar signs, gait normal Psychiatric: normal affect, behavior normal, pleasant  GU/rectal - deferred/declined     Assessment and Plan :   Encounter Diagnoses  Name Primary?   Encounter for health maintenance examination in adult Yes   Need for pneumococcal vaccination    Low testosterone    Essential hypertension, benign    Erectile dysfunction, unspecified erectile dysfunction type    Former smoker    High risk medication use    Hyperlipidemia, unspecified hyperlipidemia type    Hypogonadism in male    Screening for prostate cancer    Stage  3 chronic kidney disease, unspecified whether stage 3a or 3b CKD (HCC)    Tubular adenoma    Vaccine counseling    Anemia of renal disease    Benign prostatic hyperplasia without lower urinary tract symptoms    Screening for diabetes mellitus     This visit was a preventative care visit, also known as wellness visit or routine physical.   Topics typically include healthy lifestyle, diet, exercise, preventative care, vaccinations, sick and well care, proper use of emergency dept and after hours care, as well as other concerns.     Recommendations: Continue to return yearly for your annual wellness and preventative care visits.  This gives [place] a chance to discuss healthy lifestyle, exercise, vaccinations, review your chart record, and perform screenings where appropriate.  I recommend you see your eye doctor yearly for routine vision care.  I recommend you see your dentist yearly for routine dental care including hygiene visits twice yearly.   Vaccination recommendations were  reviewed Immunization History  Administered Date(s) Administered   Influenza,inj,Quad PF,6+ Mos 06/04/2020, 04/01/2021   PFIZER(Purple Top)SARS-COV-2 Vaccination 10/09/2019, 10/30/2019, 06/04/2020   Pneumococcal Polysaccharide-23 04/07/2022   Tdap 12/14/2017    Shingles vaccine:  I recommend you have a shingles vaccine to help prevent shingles or herpes zoster outbreak.   Please call your insurer to inquire about coverage for the Shingrix vaccine given in 2 doses.   Some insurers cover this vaccine after age 42, some cover this after age 54.  If your insurer covers this, then call to schedule appointment to have this vaccine here.  Counseled on the pneumococcal vaccine.  Vaccine information sheet given.  Pneumococcal vaccine PPSV23 given after consent obtained.  He declines flu shot today   Screening for cancer: Colon cancer screening: Due 2024 for repeat  We discussed PSA, prostate exam, and prostate cancer screening risks/benefits.     Skin cancer screening: Check your skin regularly for new changes, growing lesions, or other lesions of concern Come in for evaluation if you have skin lesions of concern.  Lung cancer screening: If you have a greater than 20 pack year history of tobacco use, then you may qualify for lung cancer screening with a chest CT scan.   Please call your insurance company to inquire about coverage for this test.  We currently don't have screenings for other cancers besides breast, cervical, colon, and lung cancers.  If you have a strong family history of cancer or have other cancer screening concerns, please let me know.    Bone health: Get at least 150 minutes of aerobic exercise weekly Get weight bearing exercise at least once weekly Bone density test:  A bone density test is an imaging test that uses a type of X-ray to measure the amount of calcium and other minerals in your bones. The test may be used to diagnose or screen you for a condition that  causes weak or thin bones (osteoporosis), predict your risk for a broken bone (fracture), or determine how well your osteoporosis treatment is working. The bone density test is recommended for females 76 and older, or females or males <64 if certain risk factors such as thyroid disease, long term use of steroids such as for asthma or rheumatological issues, vitamin D deficiency, estrogen deficiency, family history of osteoporosis, self or family history of fragility fracture in first degree relative.    Heart health: Get at least 150 minutes of aerobic exercise weekly Limit alcohol It is important to maintain a  healthy blood pressure and healthy cholesterol numbers  Heart disease screening: Screening for heart disease includes screening for blood pressure, fasting lipids, glucose/diabetes screening, BMI height to weight ratio, reviewed of smoking status, physical activity, and diet.    Goals include blood pressure 120/80 or less, maintaining a healthy lipid/cholesterol profile, preventing diabetes or keeping diabetes numbers under good control, not smoking or using tobacco products, exercising most days per week or at least 150 minutes per week of exercise, and eating healthy variety of fruits and vegetables, healthy oils, and avoiding unhealthy food choices like fried food, fast food, high sugar and high cholesterol foods.    Other tests may possibly include EKG test, CT coronary calcium score, echocardiogram, exercise treadmill stress test.     Consider CT coronary calcium score to further evaluate coronary atherosclerosis   Medical care options: I recommend you continue to seek care here first for routine care.  We try really hard to have available appointments Monday through Friday daytime hours for sick visits, acute visits, and physicals.  Urgent care should be used for after hours and weekends for significant issues that cannot wait till the next day.  The emergency department should be  used for significant potentially life-threatening emergencies.  The emergency department is expensive, can often have long wait times for less significant concerns, so try to utilize primary care, urgent care, or telemedicine when possible to avoid unnecessary trips to the emergency department.  Virtual visits and telemedicine have been introduced since the pandemic started in 2020, and can be convenient ways to receive medical care.  We offer virtual appointments as well to assist you in a variety of options to seek medical care.    Separate significant issues discussed: Hypertension-compliant with medications, routine labs today  Low testosterone-compliant with testosterone injections, routine labs including PSA screening today.  He does have hypergonadism on exam.   TST IM injection given today  Hyperlipidemia-continue statin, routine labs today  Glaucoma-follow-up with eye doctor as scheduled  CKD 3-I reviewed nephrology notes from 01/2022 showing anemia of renal disease was stable at that time, CKD 3 for sclerosis, stable renal function at that time  Of note, we discovered today that his nephrology notes erroneously shows history of ADD and Rheumatoid arthrits.   We will contact nephrology to ask those be removed from his charts due to error.      Dsean was seen today for fasting cpe.  Diagnoses and all orders for this visit:  Encounter for health maintenance examination in adult -     Lipid panel -     PSA -     Renal Function Panel -     Hepatic function panel -     Cancel: POCT Urinalysis DIP (Proadvantage Device) -     Hemoglobin A1c -     VITAMIN D 25 Hydroxy (Vit-D Deficiency, Fractures)  Need for pneumococcal vaccination -     Pneumococcal polysaccharide vaccine 23-valent greater than or equal to 2yo subcutaneous/IM  Low testosterone -     Cancel: Testosterone -     testosterone cypionate (DEPOTESTOSTERONE CYPIONATE) injection 100 mg  Essential hypertension,  benign  Erectile dysfunction, unspecified erectile dysfunction type  Former smoker  High risk medication use  Hyperlipidemia, unspecified hyperlipidemia type  Hypogonadism in male  Screening for prostate cancer -     PSA  Stage 3 chronic kidney disease, unspecified whether stage 3a or 3b CKD (HCC) -     VITAMIN D 25 Hydroxy (Vit-D Deficiency,  Fractures)  Tubular adenoma  Vaccine counseling  Anemia of renal disease  Benign prostatic hyperplasia without lower urinary tract symptoms -     PSA  Screening for diabetes mellitus -     Hemoglobin A1c   Follow-up pending labs, yearly for physical

## 2022-04-08 ENCOUNTER — Other Ambulatory Visit: Payer: Self-pay | Admitting: Medical

## 2022-04-08 LAB — HEPATIC FUNCTION PANEL
ALT: 18 IU/L (ref 0–44)
AST: 20 IU/L (ref 0–40)
Alkaline Phosphatase: 68 IU/L (ref 44–121)
Bilirubin Total: 0.6 mg/dL (ref 0.0–1.2)
Bilirubin, Direct: 0.18 mg/dL (ref 0.00–0.40)
Total Protein: 8 g/dL (ref 6.0–8.5)

## 2022-04-08 LAB — LIPID PANEL
Chol/HDL Ratio: 3.1 ratio (ref 0.0–5.0)
Cholesterol, Total: 155 mg/dL (ref 100–199)
HDL: 50 mg/dL (ref >39)
LDL Chol Calc (NIH): 91 mg/dL (ref 0–99)
Triglycerides: 69 mg/dL (ref 0–149)
VLDL Cholesterol Cal: 14 mg/dL (ref 5–40)

## 2022-04-08 LAB — RENAL FUNCTION PANEL
Albumin: 5.1 g/dL — ABNORMAL HIGH (ref 3.8–4.9)
BUN/Creatinine Ratio: 12 (ref 9–20)
BUN: 18 mg/dL (ref 6–24)
CO2: 22 mmol/L (ref 20–29)
Calcium: 9.8 mg/dL (ref 8.7–10.2)
Chloride: 101 mmol/L (ref 96–106)
Creatinine, Ser: 1.54 mg/dL — ABNORMAL HIGH (ref 0.76–1.27)
Glucose: 79 mg/dL (ref 70–99)
Phosphorus: 3.9 mg/dL (ref 2.8–4.1)
Potassium: 4.1 mmol/L (ref 3.5–5.2)
Sodium: 142 mmol/L (ref 134–144)
eGFR: 52 mL/min/{1.73_m2} — ABNORMAL LOW (ref >59)

## 2022-04-08 LAB — VITAMIN D 25 HYDROXY (VIT D DEFICIENCY, FRACTURES): Vit D, 25-Hydroxy: 18 ng/mL — ABNORMAL LOW (ref 30.0–100.0)

## 2022-04-08 LAB — PSA: Prostate Specific Ag, Serum: 1.7 ng/mL (ref 0.0–4.0)

## 2022-04-08 LAB — HEMOGLOBIN A1C
Est. average glucose Bld gHb Est-mCnc: 134 mg/dL
Hgb A1c MFr Bld: 6.3 % — ABNORMAL HIGH (ref 4.8–5.6)

## 2022-04-08 MED ORDER — ALBUTEROL SULFATE HFA 108 (90 BASE) MCG/ACT IN AERS: INHALATION_SPRAY | RESPIRATORY_TRACT | 1 refills | Status: AC

## 2022-04-08 MED ORDER — AMLODIPINE BESYLATE 10 MG PO TABS: 10.0000 mg | ORAL_TABLET | Freq: Every day | ORAL | 3 refills | Status: DC

## 2022-04-08 MED ORDER — VALACYCLOVIR HCL 1 G PO TABS: ORAL_TABLET | ORAL | 3 refills | Status: DC

## 2022-04-08 MED ORDER — IRBESARTAN 150 MG PO TABS: 150.0000 mg | ORAL_TABLET | Freq: Every day | ORAL | 3 refills | Status: DC

## 2022-04-08 MED ORDER — PRAVASTATIN SODIUM 20 MG PO TABS: 20.0000 mg | ORAL_TABLET | Freq: Every day | ORAL | 3 refills | Status: DC

## 2022-04-08 MED ORDER — VITAMIN D 50 MCG (2000 UT) PO CAPS: 1.0000 | ORAL_CAPSULE | Freq: Every day | ORAL | 3 refills | Status: DC

## 2022-04-08 NOTE — Progress Notes (Signed)
Spoke with [employer] and they have taken that out of his chart and will Call [facility] as they order that referral and will cancel this. I have notified pt of this and told him to disregard referral

## 2022-04-08 NOTE — Progress Notes (Signed)
Left another message with Amber about details that needs to be corrected and asked about referral. Waiting for a call back

## 2022-04-12 ENCOUNTER — Telehealth: Payer: Self-pay | Admitting: Medical

## 2022-04-12 NOTE — Telephone Encounter (Signed)
P.A. SILDENAFIL not covered multiple attempts

## 2022-04-22 ENCOUNTER — Encounter: Payer: Self-pay | Admitting: Internal Medicine

## 2022-04-28 ENCOUNTER — Other Ambulatory Visit (INDEPENDENT_AMBULATORY_CARE_PROVIDER_SITE_OTHER): Payer: Commercial Managed Care - HMO

## 2022-04-28 DIAGNOSIS — R7989 Other specified abnormal findings of blood chemistry: Secondary | ICD-10-CM | POA: Diagnosis not present

## 2022-04-28 MED ORDER — TESTOSTERONE CYPIONATE 200 MG/ML IM SOLN
200.0000 mg | INTRAMUSCULAR | Status: DC
  Administered 2022-04-28: 200 mg via INTRAMUSCULAR

## 2022-05-19 ENCOUNTER — Other Ambulatory Visit (INDEPENDENT_AMBULATORY_CARE_PROVIDER_SITE_OTHER): Payer: Commercial Managed Care - HMO

## 2022-05-19 DIAGNOSIS — E291 Testicular hypofunction: Secondary | ICD-10-CM

## 2022-05-19 MED ORDER — TESTOSTERONE CYPIONATE 200 MG/ML IM SOLN
200.0000 mg | Freq: Once | INTRAMUSCULAR | Status: AC
  Administered 2022-05-19: 200 mg via INTRAMUSCULAR

## 2022-05-21 ENCOUNTER — Other Ambulatory Visit: Payer: Self-pay | Admitting: Medical

## 2022-05-21 ENCOUNTER — Telehealth: Payer: Self-pay | Admitting: Medical

## 2022-05-21 MED ORDER — TESTOSTERONE CYPIONATE 200 MG/ML IM SOLN: INTRAMUSCULAR | 2 refills | Status: DC

## 2022-05-21 NOTE — Telephone Encounter (Signed)
Pt called requesting refill on testosterone  [facility]

## 2022-06-09 ENCOUNTER — Other Ambulatory Visit: Payer: Self-pay | Admitting: Medical

## 2022-06-09 ENCOUNTER — Other Ambulatory Visit (INDEPENDENT_AMBULATORY_CARE_PROVIDER_SITE_OTHER): Payer: Commercial Managed Care - HMO

## 2022-06-09 ENCOUNTER — Telehealth: Payer: Self-pay | Admitting: *Deleted

## 2022-06-09 DIAGNOSIS — R7989 Other specified abnormal findings of blood chemistry: Secondary | ICD-10-CM

## 2022-06-09 MED ORDER — TESTOSTERONE CYPIONATE 200 MG/ML IM SOLN
200.0000 mg | INTRAMUSCULAR | Status: DC
  Administered 2022-06-09: 200 mg via INTRAMUSCULAR

## 2022-06-09 MED ORDER — TESTOSTERONE CYPIONATE 200 MG/ML IM SOLN: INTRAMUSCULAR | 1 refills | Status: DC

## 2022-06-09 NOTE — Telephone Encounter (Signed)
Patient is seeing you 06/30/22 for med check for testosterone. Coming in for labs on 06/23/22-need future orders please and thanks.

## 2022-06-17 ENCOUNTER — Other Ambulatory Visit: Payer: Self-pay | Admitting: Medical

## 2022-06-17 NOTE — Telephone Encounter (Signed)
Is this okay to refill? 

## 2022-06-23 ENCOUNTER — Other Ambulatory Visit: Payer: Commercial Managed Care - HMO

## 2022-06-23 DIAGNOSIS — R7989 Other specified abnormal findings of blood chemistry: Secondary | ICD-10-CM

## 2022-06-26 LAB — TESTOSTERONE: Testosterone: 416 ng/dL (ref 264–916)

## 2022-06-26 NOTE — Progress Notes (Signed)
Thankfully testosterone level is at goal.  When was the last injection and how often is he currently doing injections just to be clear?

## 2022-06-30 ENCOUNTER — Ambulatory Visit (INDEPENDENT_AMBULATORY_CARE_PROVIDER_SITE_OTHER): Payer: Commercial Managed Care - HMO | Admitting: Medical

## 2022-06-30 ENCOUNTER — Other Ambulatory Visit: Payer: Commercial Managed Care - HMO

## 2022-06-30 ENCOUNTER — Encounter: Payer: Self-pay | Admitting: Medical

## 2022-06-30 VITALS — BP 130/76 | HR 80 | Ht 75.0 in | Wt 182.2 lb

## 2022-06-30 DIAGNOSIS — N183 Chronic kidney disease, stage 3 unspecified: Secondary | ICD-10-CM | POA: Diagnosis not present

## 2022-06-30 DIAGNOSIS — E785 Hyperlipidemia, unspecified: Secondary | ICD-10-CM

## 2022-06-30 DIAGNOSIS — E291 Testicular hypofunction: Secondary | ICD-10-CM | POA: Diagnosis not present

## 2022-06-30 DIAGNOSIS — Z23 Encounter for immunization: Secondary | ICD-10-CM | POA: Diagnosis not present

## 2022-06-30 DIAGNOSIS — I10 Essential (primary) hypertension: Secondary | ICD-10-CM | POA: Diagnosis not present

## 2022-06-30 DIAGNOSIS — R7989 Other specified abnormal findings of blood chemistry: Secondary | ICD-10-CM

## 2022-06-30 DIAGNOSIS — Z136 Encounter for screening for cardiovascular disorders: Secondary | ICD-10-CM

## 2022-06-30 DIAGNOSIS — Z79899 Other long term (current) drug therapy: Secondary | ICD-10-CM

## 2022-06-30 MED ORDER — TESTOSTERONE CYPIONATE 200 MG/ML IM SOLN: 200.0000 mg | INTRAMUSCULAR | 5 refills | Status: DC

## 2022-06-30 MED ORDER — SILDENAFIL CITRATE 20 MG PO TABS: ORAL_TABLET | ORAL | 5 refills | Status: DC

## 2022-06-30 NOTE — Progress Notes (Signed)
Subjective:  Shawn Pena is a 56 y.o. male who presents for Chief Complaint  Patient presents with   Hypogonadism    Nonfasting med check. [facility]      Here for med check  Low testosterone-compliant with testosterone injection every 21 days.  Doing okay on this.  Energy level still pretty good most of the time.  Hypertension-compliant with medication.  No chest pain or palpitations no edema  Hyperlipidemia-compliant with statin without complaint  CKD-no concerns, has seen nephrology in the past year  No other aggravating or relieving factors.    No other c/o.  Past Medical History:  Diagnosis Date   Erectile dysfunction    Genital herpes    Glaucoma    Hyperlipidemia    Hypertension    Hypogonadism male    Low testosterone    Current Outpatient Medications on File Prior to Visit  Medication Sig Dispense Refill   amLODipine (NORVASC) 10 MG tablet Take 1 tablet (10 mg total) by mouth daily. 90 tablet 3   brimonidine-timolol (COMBIGAN) 0.2-0.5 % ophthalmic solution Place 2 drops into both eyes every 12 (twelve) hours.     Cholecalciferol (VITAMIN D) 50 MCG (2000 UT) CAPS Take 1 capsule (2,000 Units total) by mouth daily. 90 capsule 3   clindamycin-benzoyl peroxide (BENZACLIN) gel APPLY TOPICALLY TWICE A DAY 25 g 0   irbesartan (AVAPRO) 150 MG tablet Take 1 tablet (150 mg total) by mouth daily. 90 tablet 3   pravastatin (PRAVACHOL) 20 MG tablet Take 1 tablet (20 mg total) by mouth daily. 90 tablet 3   VYZULTA 0.024 % SOLN Apply 1 drop to eye at bedtime.     acetaminophen (TYLENOL) 325 MG tablet Take 650 mg by mouth every 6 (six) hours as needed. (Patient not taking: Reported on 06/30/2022)     albuterol (VENTOLIN HFA) 108 (90 Base) MCG/ACT inhaler TAKE 2 PUFFS BY MOUTH EVERY 6 HOURS AS NEEDED FOR WHEEZE OR SHORTNESS OF BREATH (Patient not taking: Reported on 06/30/2022) 8.5 each 1   valACYclovir (VALTREX) 1000 MG  tablet TAKE 2 TABLETS BY MOUTH TWICE A DAY FOR 2 DAYS FOR FLARE UP (Patient not taking: Reported on 06/30/2022) 20 tablet 3   Current Facility-Administered Medications on File Prior to Visit  Medication Dose Route Frequency Provider Last Rate Last Admin   testosterone cypionate (DEPOTESTOSTERONE CYPIONATE) injection 200 mg  200 mg Intramuscular Q28 days Carlena Hurl, PA-C   200 mg at 04/28/22 1017   testosterone cypionate (DEPOTESTOSTERONE CYPIONATE) injection 200 mg  200 mg Intramuscular Q21 days Carlena Hurl, PA-C   200 mg at 06/09/22 1425     The following portions of the patient's history were reviewed and updated as appropriate: allergies, current medications, past family history, past medical history, past social history, past surgical history and problem list.  ROS Otherwise as in subjective above  Objective: BP 130/76   Pulse 80   Ht '6\' 3"'$  (1.905 m)   Wt 182 lb 3.2 oz (82.6 kg)   BMI 22.77 kg/m   General appearance: alert, no distress, well developed, well nourished Heart: RRR, normal S1, S2, no murmurs Lungs: CTA bilaterally, no wheezes, rhonchi, or rales Pulses: 2+ radial pulses, 2+ pedal pulses, normal cap refill Ext: no edema   Assessment: Encounter Diagnoses  Name Primary?   Essential hypertension, benign Yes   Hypogonadism in male    Stage 3 chronic kidney disease, unspecified whether stage  3a or 3b CKD ([place])    Hyperlipidemia, unspecified hyperlipidemia type    Low testosterone    High risk medication use      Plan: Hypertension-continue amlodipine 10 mg daily, irbesartan 150 mg daily  Hyperlipidemia-continue pravastatin 20 mg daily  CKD 3-stable, reviewed 2023 nephrology notes  Low testosterone, hypogonadism-continue testosterone 2 mg injection IM every 21 days.  His recent blood test was about 2 weeks after his last injection and look good in the 400 range  Impaired glucose-continue efforts to eat healthy diet and stay away from a lot of  sugar  Counseled on the influenza virus vaccine.  Vaccine information sheet given.  Influenza vaccine given after consent obtained.  I recommend he return on a different date for shingles vaccine #1  I recommended baseline CT coronary calcium test for heart disease screening.  He will consider  Callen was seen today for hypogonadism.  Diagnoses and all orders for this visit:  Essential hypertension, benign  Hypogonadism in male  Stage 3 chronic kidney disease, unspecified whether stage 3a or 3b CKD ([place])  Hyperlipidemia, unspecified hyperlipidemia type  Low testosterone  High risk medication use  Other orders -     testosterone cypionate (DEPOTESTOSTERONE CYPIONATE) 200 MG/ML injection; Inject 1 mL (200 mg total) into the muscle every 21 ( twenty-one) days. -     sildenafil (REVATIO) 20 MG tablet; 3 tablets daily prn    Follow up: 62mo

## 2022-06-30 NOTE — Addendum Note (Signed)
Addended by: Carolee Rota F on: 06/30/2022 02:14 PM   Modules accepted: Orders

## 2022-07-08 ENCOUNTER — Other Ambulatory Visit (INDEPENDENT_AMBULATORY_CARE_PROVIDER_SITE_OTHER): Payer: Commercial Managed Care - HMO

## 2022-07-08 ENCOUNTER — Telehealth: Payer: Self-pay | Admitting: Internal Medicine

## 2022-07-08 DIAGNOSIS — E291 Testicular hypofunction: Secondary | ICD-10-CM | POA: Diagnosis not present

## 2022-07-08 MED ORDER — TESTOSTERONE CYPIONATE 200 MG/ML IM SOLN
200.0000 mg | INTRAMUSCULAR | Status: DC
  Administered 2022-07-08: 200 mg via INTRAMUSCULAR

## 2022-07-08 NOTE — Telephone Encounter (Signed)
Working on [employer] Sildenafil Citrate '20MG'$  tablets. Having issues with Covermymeds so will complete in office on 07/09/2022

## 2022-07-09 NOTE — Telephone Encounter (Signed)
Waiting on PA to see if it is approved or not.  Spoke with [employer] and pt has been using [employer] card #60 is $17.00 and #100 is $20 if not covered under insurance

## 2022-07-10 NOTE — Telephone Encounter (Signed)
Pt is aware not covered by plan and to pay out of pocket with good rx card

## 2022-07-25 ENCOUNTER — Telehealth: Payer: Self-pay | Admitting: Medical

## 2022-07-25 ENCOUNTER — Other Ambulatory Visit: Payer: Self-pay | Admitting: Medical

## 2022-07-25 NOTE — Telephone Encounter (Signed)
I did call patient to let him know that [facility], asked if he was wanting this sent to [facility] instead? He said he had made a mistake and to disregard, thanks.

## 2022-07-25 NOTE — Telephone Encounter (Signed)
Pt needs testosterone refills sent to [facility] [place] [facility] [place] [facility] [place]

## 2022-07-28 ENCOUNTER — Other Ambulatory Visit (INDEPENDENT_AMBULATORY_CARE_PROVIDER_SITE_OTHER): Payer: Commercial Managed Care - HMO

## 2022-07-28 DIAGNOSIS — E291 Testicular hypofunction: Secondary | ICD-10-CM | POA: Diagnosis not present

## 2022-07-28 MED ORDER — TESTOSTERONE CYPIONATE 200 MG/ML IM SOLN
200.0000 mg | INTRAMUSCULAR | Status: DC
  Administered 2022-07-28: 200 mg via INTRAMUSCULAR

## 2022-08-17 ENCOUNTER — Other Ambulatory Visit: Payer: Self-pay | Admitting: Medical

## 2022-08-18 ENCOUNTER — Other Ambulatory Visit: Payer: Self-pay | Admitting: Medical

## 2022-08-18 ENCOUNTER — Other Ambulatory Visit (INDEPENDENT_AMBULATORY_CARE_PROVIDER_SITE_OTHER): Payer: Commercial Managed Care - HMO

## 2022-08-18 DIAGNOSIS — E291 Testicular hypofunction: Secondary | ICD-10-CM | POA: Diagnosis not present

## 2022-08-18 MED ORDER — TESTOSTERONE CYPIONATE 200 MG/ML IM SOLN
200.0000 mg | INTRAMUSCULAR | Status: DC
  Administered 2022-08-18 – 2022-09-08 (×2): 200 mg via INTRAMUSCULAR

## 2022-09-03 ENCOUNTER — Encounter: Payer: Self-pay | Admitting: Medical

## 2022-09-03 ENCOUNTER — Ambulatory Visit (INDEPENDENT_AMBULATORY_CARE_PROVIDER_SITE_OTHER): Payer: Commercial Managed Care - HMO | Admitting: Medical

## 2022-09-03 VITALS — BP 132/86 | HR 85 | Temp 98.6°F | Resp 16 | Wt 189.0 lb

## 2022-09-03 DIAGNOSIS — M25562 Pain in left knee: Secondary | ICD-10-CM | POA: Diagnosis not present

## 2022-09-03 NOTE — Patient Instructions (Signed)
Encounter Diagnosis  Name Primary?   Acute pain of left knee Yes   Your symptoms and exam suggest some acute inflammation, sprain strain injury  Recommendations: Use cold therapy for the next 3 to 4 days such as bag of ice water or bag of frozen peas for 15 minutes 3 times a day.  Use a cloth between the ice and your leg to avoid burning Use over-the-counter Aleve or ibuprofen for the next 3 to 4 days only.  You can use 1 Aleve twice a day over-the-counter.   or you can use ibuprofen over-the-counter 200 mg, 3 tablets twice a day for 3 to 4 days. Rest the leg for the next few days when possible You can continue using a knee sleeve that you are using currently for the next 7 to 10 days Resume activity gradually as your pain resolves  If new or worse symptoms in the next week then let me know

## 2022-09-03 NOTE — Progress Notes (Signed)
Subjective:  Shawn Pena is a 57 y.o. male who presents for Chief Complaint  Patient presents with   Leg Pain    Complains of left leg pain x 1 day. Pain is located behind his left knee, then radiates to his calf.      Here for c/o left leg pain.  Having problems extending and bending leg.  Feels some possible swelling behind knee.  Hard to walk on the leg and bend down.   Just started yesterday.   No fall, no injury.  No discoloration or bruising.  No recent activity involving kneeling directly on the knees.  No history of chronic knee pain.  He does stand for work cutting hair.  He did do some dancing a few nights ago though.  No other aggravating or relieving factors.    No other c/o.  The following portions of the patient's history were reviewed and updated as appropriate: allergies, current medications, past family history, past medical history, past social history, past surgical history and problem list.  ROS Otherwise as in subjective above  Objective: BP 132/86   Pulse 85   Temp 98.6 F (37 C) (Tympanic)   Resp 16   Wt 189 lb (85.7 kg)   SpO2 96% Comment: room air  BMI 23.62 kg/m   General appearance: alert, no distress, well developed, well nourished No obvious deformity or swelling or discoloration of the left leg, hip normal range of motion nontender, leg muscles of the thigh nontender, lower leg nontender including no calf tenderness or swelling or asymmetry, he is tender over the left knee medial joint line and tender over the lateral biceps tendon posteriorly, no swelling of the posterior knee, he cannot fully extend the knee and does have pain with extension and flexion, rest of knee unremarkable, no laxity or obvious swelling Legs neurovascularly intact   Assessment: Encounter Diagnosis  Name Primary?   Acute pain of left knee Yes     Plan: Your symptoms and exam suggest some acute inflammation, sprain strain injury  Recommendations: Use cold therapy for  the next 3 to 4 days such as bag of ice water or bag of frozen peas for 15 minutes 3 times a day.  Use a cloth between the ice and your leg to avoid burning Use over-the-counter Aleve or ibuprofen for the next 3 to 4 days only.  You can use 1 Aleve twice a day over-the-counter.   or you can use ibuprofen over-the-counter 200 mg, 3 tablets twice a day for 3 to 4 days. Rest the leg for the next few days when possible You can continue using a knee sleeve that you are using currently for the next 7 to 10 days Resume activity gradually as your pain resolves  If new or worse symptoms in the next week then let me know  Jayant was seen today for leg pain.  Diagnoses and all orders for this visit:  Acute pain of left knee    Follow up: prn

## 2022-09-08 ENCOUNTER — Other Ambulatory Visit (INDEPENDENT_AMBULATORY_CARE_PROVIDER_SITE_OTHER): Payer: Commercial Managed Care - HMO

## 2022-09-08 DIAGNOSIS — E291 Testicular hypofunction: Secondary | ICD-10-CM | POA: Diagnosis not present

## 2022-09-26 ENCOUNTER — Other Ambulatory Visit: Payer: Self-pay | Admitting: Medical

## 2022-09-29 ENCOUNTER — Other Ambulatory Visit (INDEPENDENT_AMBULATORY_CARE_PROVIDER_SITE_OTHER): Payer: Commercial Managed Care - HMO

## 2022-09-29 DIAGNOSIS — R7989 Other specified abnormal findings of blood chemistry: Secondary | ICD-10-CM | POA: Diagnosis not present

## 2022-09-29 MED ORDER — TESTOSTERONE CYPIONATE 200 MG/ML IM SOLN
200.0000 mg | Freq: Once | INTRAMUSCULAR | Status: AC
  Administered 2022-09-29: 200 mg via INTRAMUSCULAR

## 2022-10-06 ENCOUNTER — Ambulatory Visit: Payer: Commercial Managed Care - HMO | Admitting: Family Medicine

## 2022-10-20 ENCOUNTER — Other Ambulatory Visit (INDEPENDENT_AMBULATORY_CARE_PROVIDER_SITE_OTHER): Payer: Commercial Managed Care - HMO

## 2022-10-20 DIAGNOSIS — E291 Testicular hypofunction: Secondary | ICD-10-CM | POA: Diagnosis not present

## 2022-10-20 MED ORDER — TESTOSTERONE CYPIONATE 200 MG/ML IM SOLN
200.0000 mg | INTRAMUSCULAR | Status: DC
  Administered 2022-10-20: 200 mg via INTRAMUSCULAR

## 2022-11-10 ENCOUNTER — Other Ambulatory Visit (INDEPENDENT_AMBULATORY_CARE_PROVIDER_SITE_OTHER): Payer: Commercial Managed Care - HMO

## 2022-11-10 DIAGNOSIS — R7989 Other specified abnormal findings of blood chemistry: Secondary | ICD-10-CM | POA: Diagnosis not present

## 2022-11-10 MED ORDER — TESTOSTERONE CYPIONATE 200 MG/ML IM SOLN
200.0000 mg | Freq: Once | INTRAMUSCULAR | Status: AC
  Administered 2022-11-10: 200 mg via INTRAMUSCULAR

## 2022-11-18 ENCOUNTER — Encounter: Payer: Self-pay | Admitting: Internal Medicine

## 2022-11-23 ENCOUNTER — Other Ambulatory Visit: Payer: Self-pay | Admitting: Medical

## 2022-12-01 ENCOUNTER — Other Ambulatory Visit (INDEPENDENT_AMBULATORY_CARE_PROVIDER_SITE_OTHER): Payer: PRIVATE HEALTH INSURANCE

## 2022-12-01 DIAGNOSIS — R7989 Other specified abnormal findings of blood chemistry: Secondary | ICD-10-CM | POA: Diagnosis not present

## 2022-12-01 MED ORDER — TESTOSTERONE CYPIONATE 200 MG/ML IM SOLN
200.0000 mg | Freq: Once | INTRAMUSCULAR | Status: AC
Start: 2022-12-01 — End: 2022-12-01
  Administered 2022-12-01: 200 mg via INTRAMUSCULAR

## 2022-12-20 ENCOUNTER — Other Ambulatory Visit: Payer: Self-pay | Admitting: Medical

## 2022-12-22 ENCOUNTER — Other Ambulatory Visit: Payer: Self-pay | Admitting: Medical

## 2022-12-22 ENCOUNTER — Other Ambulatory Visit: Payer: PRIVATE HEALTH INSURANCE

## 2022-12-22 ENCOUNTER — Other Ambulatory Visit (INDEPENDENT_AMBULATORY_CARE_PROVIDER_SITE_OTHER): Payer: PRIVATE HEALTH INSURANCE

## 2022-12-22 DIAGNOSIS — E291 Testicular hypofunction: Secondary | ICD-10-CM | POA: Diagnosis not present

## 2022-12-22 MED ORDER — TESTOSTERONE CYPIONATE 200 MG/ML IM SOLN
200.0000 mg | Freq: Once | INTRAMUSCULAR | Status: AC
Start: 2022-12-22 — End: 2022-12-22
  Administered 2022-12-22: 200 mg via INTRAMUSCULAR

## 2022-12-22 MED ORDER — TESTOSTERONE CYPIONATE 200 MG/ML IM SOLN: INTRAMUSCULAR | 2 refills | Status: DC

## 2022-12-22 NOTE — Telephone Encounter (Signed)
Pt called concerning this medication, testosterone. He states he is completely out. He was schedule to come in today for injection and had to cancel appt. He needs refilled ASAP. [facility] to [facility] on [address]. Pt can be reached at [phone].

## 2023-01-12 ENCOUNTER — Other Ambulatory Visit (INDEPENDENT_AMBULATORY_CARE_PROVIDER_SITE_OTHER): Payer: PRIVATE HEALTH INSURANCE

## 2023-01-12 DIAGNOSIS — E291 Testicular hypofunction: Secondary | ICD-10-CM | POA: Diagnosis not present

## 2023-01-12 MED ORDER — TESTOSTERONE CYPIONATE 200 MG/ML IM SOLN
200.0000 mg | Freq: Once | INTRAMUSCULAR | Status: AC
Start: 2023-01-12 — End: 2023-01-12
  Administered 2023-01-12: 200 mg via INTRAMUSCULAR

## 2023-01-26 ENCOUNTER — Other Ambulatory Visit: Payer: Self-pay | Admitting: Medical

## 2023-02-02 ENCOUNTER — Other Ambulatory Visit: Payer: PRIVATE HEALTH INSURANCE

## 2023-02-03 ENCOUNTER — Telehealth: Payer: Self-pay | Admitting: Medical

## 2023-02-03 NOTE — Telephone Encounter (Signed)
(  KeyRanae Pila) PA Case ID #: [id number] Rx #: [id number] Need Help? Call us at [phone] Status sent iconSent to Plan today Drug Testosterone Cypionate 200MG /ML intramuscular solution ePA cloud logo Form [facility] Electronic Prior Authorization Form [phone] NCPDP)

## 2023-02-06 NOTE — Telephone Encounter (Signed)
P.A. approved til 02/03/24, faxed pharmacy

## 2023-02-09 ENCOUNTER — Other Ambulatory Visit: Payer: Self-pay | Admitting: Medical

## 2023-02-09 ENCOUNTER — Other Ambulatory Visit (INDEPENDENT_AMBULATORY_CARE_PROVIDER_SITE_OTHER): Payer: PRIVATE HEALTH INSURANCE

## 2023-02-09 ENCOUNTER — Telehealth: Payer: Self-pay | Admitting: Medical

## 2023-02-09 DIAGNOSIS — E291 Testicular hypofunction: Secondary | ICD-10-CM | POA: Diagnosis not present

## 2023-02-09 MED ORDER — TESTOSTERONE CYPIONATE 200 MG/ML IM SOLN: INTRAMUSCULAR | 3 refills | Status: DC

## 2023-02-09 MED ORDER — TESTOSTERONE CYPIONATE 200 MG/ML IM SOLN
200.0000 mg | Freq: Once | INTRAMUSCULAR | Status: AC
Start: 2023-02-09 — End: 2023-02-09
  Administered 2023-02-09: 200 mg via INTRAMUSCULAR

## 2023-02-09 NOTE — Telephone Encounter (Signed)
Prescription Request  02/09/2023  LOV: 09/03/2022  What is the name of the medication or equipment? Testosterone Cypionate   Have you contacted your pharmacy to request a refill? Yes   Which [facility] you [facility]    Patient notified that their request is being sent to the clinical staff for review and that they should receive a response within 2 business days.   Please advise at [facility] [phone]

## 2023-02-11 NOTE — Telephone Encounter (Signed)
done

## 2023-02-23 ENCOUNTER — Encounter: Payer: Self-pay | Admitting: Internal Medicine

## 2023-03-01 ENCOUNTER — Other Ambulatory Visit: Payer: Self-pay | Admitting: Medical

## 2023-03-02 ENCOUNTER — Other Ambulatory Visit (INDEPENDENT_AMBULATORY_CARE_PROVIDER_SITE_OTHER): Payer: PRIVATE HEALTH INSURANCE

## 2023-03-02 DIAGNOSIS — E291 Testicular hypofunction: Secondary | ICD-10-CM

## 2023-03-02 MED ORDER — TESTOSTERONE CYPIONATE 200 MG/ML IM SOLN
200.0000 mg | INTRAMUSCULAR | Status: DC
Start: 2023-03-02 — End: 2023-05-11
  Administered 2023-03-02: 200 mg via INTRAMUSCULAR

## 2023-03-21 ENCOUNTER — Other Ambulatory Visit: Payer: Self-pay | Admitting: Medical

## 2023-03-22 ENCOUNTER — Other Ambulatory Visit: Payer: Self-pay | Admitting: Medical

## 2023-03-23 ENCOUNTER — Telehealth: Payer: Self-pay | Admitting: Medical

## 2023-03-23 ENCOUNTER — Other Ambulatory Visit: Payer: PRIVATE HEALTH INSURANCE

## 2023-03-23 NOTE — Telephone Encounter (Signed)
Please verify pt test refill it says inject into the muscle every 21(28)days and they wouldn't let him get the medicine and he normally gets it every 21 days and comes every 3 weeks to get a shot

## 2023-03-30 ENCOUNTER — Other Ambulatory Visit (INDEPENDENT_AMBULATORY_CARE_PROVIDER_SITE_OTHER): Payer: PRIVATE HEALTH INSURANCE

## 2023-03-30 DIAGNOSIS — R7989 Other specified abnormal findings of blood chemistry: Secondary | ICD-10-CM | POA: Diagnosis not present

## 2023-03-30 MED ORDER — TESTOSTERONE CYPIONATE 200 MG/ML IM SOLN
200.0000 mg | INTRAMUSCULAR | Status: DC
Start: 2023-03-30 — End: 2023-05-11
  Administered 2023-03-30: 200 mg via INTRAMUSCULAR

## 2023-04-13 ENCOUNTER — Ambulatory Visit: Payer: PRIVATE HEALTH INSURANCE

## 2023-04-13 ENCOUNTER — Other Ambulatory Visit: Payer: PRIVATE HEALTH INSURANCE

## 2023-04-13 VITALS — Ht 75.0 in | Wt 185.0 lb

## 2023-04-13 DIAGNOSIS — Z860100 Personal history of colon polyps, unspecified: Secondary | ICD-10-CM

## 2023-04-13 MED ORDER — NA SULFATE-K SULFATE-MG SULF 17.5-3.13-1.6 GM/177ML PO SOLN
1.0000 | Freq: Once | ORAL | 0 refills | Status: AC
Start: 2023-04-13 — End: 2023-04-13

## 2023-04-13 NOTE — Progress Notes (Signed)

## 2023-04-20 ENCOUNTER — Other Ambulatory Visit: Payer: PRIVATE HEALTH INSURANCE

## 2023-04-21 ENCOUNTER — Encounter: Payer: Self-pay | Admitting: Internal Medicine

## 2023-04-23 ENCOUNTER — Other Ambulatory Visit: Payer: Self-pay | Admitting: Medical

## 2023-04-24 ENCOUNTER — Other Ambulatory Visit: Payer: Self-pay | Admitting: Medical

## 2023-04-24 NOTE — Telephone Encounter (Signed)
Pt has an appt coming up soon

## 2023-04-27 ENCOUNTER — Other Ambulatory Visit (INDEPENDENT_AMBULATORY_CARE_PROVIDER_SITE_OTHER): Payer: PRIVATE HEALTH INSURANCE

## 2023-04-27 DIAGNOSIS — E291 Testicular hypofunction: Secondary | ICD-10-CM

## 2023-04-27 MED ORDER — TESTOSTERONE CYPIONATE 200 MG/ML IM SOLN
200.0000 mg | INTRAMUSCULAR | Status: DC
Start: 2023-04-27 — End: 2023-05-11
  Administered 2023-04-27: 200 mg via INTRAMUSCULAR

## 2023-05-04 ENCOUNTER — Ambulatory Visit: Payer: PRIVATE HEALTH INSURANCE | Admitting: Internal Medicine

## 2023-05-04 ENCOUNTER — Encounter: Payer: PRIVATE HEALTH INSURANCE | Admitting: Medical

## 2023-05-04 ENCOUNTER — Encounter: Payer: Self-pay | Admitting: Internal Medicine

## 2023-05-04 VITALS — BP 126/75 | HR 71 | Temp 97.5°F | Resp 17 | Ht 75.0 in | Wt 185.0 lb

## 2023-05-04 DIAGNOSIS — Z09 Encounter for follow-up examination after completed treatment for conditions other than malignant neoplasm: Secondary | ICD-10-CM

## 2023-05-04 DIAGNOSIS — K635 Polyp of colon: Secondary | ICD-10-CM

## 2023-05-04 DIAGNOSIS — Z860100 Personal history of colon polyps, unspecified: Secondary | ICD-10-CM

## 2023-05-04 DIAGNOSIS — Z860101 Personal history of adenomatous and serrated colon polyps: Secondary | ICD-10-CM

## 2023-05-04 DIAGNOSIS — D120 Benign neoplasm of cecum: Secondary | ICD-10-CM

## 2023-05-04 MED ORDER — SODIUM CHLORIDE 0.9 % IV SOLN: 500.0000 mL | INTRAVENOUS | Status: DC

## 2023-05-04 NOTE — Op Note (Signed)
[facility] Patient Name: Shawn Pena Procedure Date: 05/04/2023 1:17 PM MRN: [id number] Endoscopist: Beverley Fiedler , MD, [id number] Age: 57 Referring MD:  Date of Birth: 01-25-1966 Gender: Male Account #: [id number] Procedure:                Colonoscopy Indications:              High risk colon cancer surveillance: Personal                            history of non-advanced adenoma, Last colonoscopy:                            April 2019 Medicines:                Monitored Anesthesia Care Procedure:                Pre-Anesthesia Assessment:                           - Prior to the procedure, a History and Physical                            was performed, and patient medications and                            allergies were reviewed. The patient's tolerance of                            previous anesthesia was also reviewed. The risks                            and benefits of the procedure and the sedation                            options and risks were discussed with the patient.                            All questions were answered, and informed consent                            was obtained. Prior Anticoagulants: The patient has                            taken no anticoagulant or antiplatelet agents. ASA                            Grade Assessment: II - A patient with mild systemic                            disease. After reviewing the risks and benefits,                            the patient was deemed in satisfactory condition to  undergo the procedure.                           After obtaining informed consent, the colonoscope                            was passed under direct vision. Throughout the                            procedure, the patient's blood pressure, pulse, and                            oxygen saturations were monitored continuously. The                            Olympus Scope SN [id number] was introduced through the                             anus and advanced to the cecum, identified by                            transillumination. The colonoscopy was performed                            without difficulty. The patient tolerated the                            procedure well. The quality of the bowel                            preparation was good. The ileocecal valve,                            appendiceal orifice, and rectum were photographed. Scope In: 1:22:06 PM Scope Out: 1:36:22 PM Scope Withdrawal Time: 0 hours 10 minutes 27 seconds  Total Procedure Duration: 0 hours 14 minutes 16 seconds  Findings:                 The digital rectal exam was normal.                           A 2 mm polyp was found in the cecum. The polyp was                            sessile. The polyp was removed with a cold biopsy                            forceps. Resection and retrieval were complete.                           A few small-mouthed diverticula were found in the                            sigmoid colon and descending colon.  The exam was otherwise without abnormality on                            direct and retroflexion views. Complications:            No immediate complications. Estimated Blood Loss:     Estimated blood loss: none. Impression:               - One 2 mm polyp in the cecum, removed with a cold                            biopsy forceps. Resected and retrieved.                           - Mild diverticulosis in the sigmoid colon and in                            the descending colon.                           - The examination was otherwise normal on direct                            and retroflexion views. Recommendation:           - Patient has a contact number available for                            emergencies. The signs and symptoms of potential                            delayed complications were discussed with the                            patient. Return  to normal activities tomorrow.                            Written discharge instructions were provided to the                            patient.                           - Resume previous diet.                           - Continue present medications.                           - Await pathology results.                           - Repeat colonoscopy is recommended for                            surveillance. The colonoscopy date will be  determined after pathology results from today's                            exam become available for review. Beverley Fiedler, MD 05/04/2023 1:50:32 PM This report has been signed electronically.

## 2023-05-04 NOTE — Progress Notes (Signed)
Called to room to assist during endoscopic procedure.  Patient ID and intended procedure confirmed with present staff. Received instructions for my participation in the procedure from the performing physician.  

## 2023-05-04 NOTE — Progress Notes (Signed)
Sedate, gd SR, tolerated procedure well, VSS, report to RN 

## 2023-05-04 NOTE — Patient Instructions (Signed)
Thank you for letting us take care of your healthcare needs today. Please see handouts given to you on Polyps and Diverticulosis.    YOU HAD AN ENDOSCOPIC PROCEDURE TODAY AT [facility]:   Refer to the procedure report that was given to you for any specific questions about what was found during the examination.  If the procedure report does not answer your questions, please call your gastroenterologist to clarify.  If you requested that your care partner not be given the details of your procedure findings, then the procedure report has been included in a sealed envelope for you to review at your convenience later.  YOU SHOULD EXPECT: Some feelings of bloating in the abdomen. Passage of more gas than usual.  Walking can help get rid of the air that was put into your GI tract during the procedure and reduce the bloating. If you had a lower endoscopy (such as a colonoscopy or flexible sigmoidoscopy) you may notice spotting of blood in your stool or on the toilet paper. If you underwent a bowel prep for your procedure, you may not have a normal bowel movement for a few days.  Please Note:  You might notice some irritation and congestion in your nose or some drainage.  This is from the oxygen used during your procedure.  There is no need for concern and it should clear up in a day or so.  SYMPTOMS TO REPORT IMMEDIATELY:  Following lower endoscopy (colonoscopy or flexible sigmoidoscopy):  Excessive amounts of blood in the stool  Significant tenderness or worsening of abdominal pains  Swelling of the abdomen that is new, acute  Fever of 100F or higher   For urgent or emergent issues, a gastroenterologist can be reached at any hour by calling [phone]. Do not use MyChart messaging for urgent concerns.    DIET:  We do recommend a small meal at first, but then you may proceed to your regular diet.  Drink plenty of fluids but you should avoid alcoholic beverages for 24  hours.  ACTIVITY:  You should plan to take it easy for the rest of today and you should NOT DRIVE or use heavy machinery until tomorrow (because of the sedation medicines used during the test).    FOLLOW UP: Our staff will call the number listed on your records the next business day following your procedure.  We will call around 7:15- 8:00 am to check on you and address any questions or concerns that you may have regarding the information given to you following your procedure. If we do not reach you, we will leave a message.     If any biopsies were taken you will be contacted by phone or by letter within the next 1-3 weeks.  Please call us at [phone] if you have not heard about the biopsies in 3 weeks.    SIGNATURES/CONFIDENTIALITY: You and/or your care partner have signed paperwork which will be entered into your electronic medical record.  These signatures attest to the fact that that the information above on your After Visit Summary has been reviewed and is understood.  Full responsibility of the confidentiality of this discharge information lies with you and/or your care-partner.

## 2023-05-04 NOTE — Progress Notes (Signed)
GASTROENTEROLOGY PROCEDURE H&P NOTE   Primary Care Physician: Jac Canavan, PA-C    Reason for Procedure:   Hx of colon polyp  Plan:    Colonoscopy  Patient is appropriate for endoscopic procedure(s) in the ambulatory ([facility]) setting.  The nature of the procedure, as well as the risks, benefits, and alternatives were carefully and thoroughly reviewed with the patient. Ample time for discussion and questions allowed. The patient understood, was satisfied, and agreed to proceed.     HPI: Shawn Pena is a 57 y.o. male who presents for surveillance colonoscopy.  Medical history as below.  Tolerated the prep.  No recent chest pain or shortness of breath.  No abdominal pain today.  Past Medical History:  Diagnosis Date   Erectile dysfunction    Genital herpes    Glaucoma    Hyperlipidemia    Hypertension    Hypogonadism male    Low testosterone     Past Surgical History:  Procedure Laterality Date   COLONOSCOPY  10/2017   Tubular Adenoma, repeat 5 years, Dr. Erick Blinks   THROAT SURGERY     vocal cord polyp   WISDOM TOOTH EXTRACTION      Prior to Admission medications   Medication Sig Start Date End Date Taking? Authorizing Provider  acetaminophen (TYLENOL) 325 MG tablet Take 650 mg by mouth every 6 (six) hours as needed.   Yes [provider]  amLODipine (NORVASC) 10 MG tablet TAKE 1 TABLET BY MOUTH EVERY DAY 03/23/23  Yes Tysinger, Kermit Balo, PA-C  brimonidine-timolol (COMBIGAN) 0.2-0.5 % ophthalmic solution Place 2 drops into both eyes every 12 (twelve) hours.   Yes [provider]  Cholecalciferol (VITAMIN D3) 50 MCG (2000 UT) capsule TAKE 1 CAPSULE BY MOUTH EVERY DAY 04/23/23  Yes Tysinger, Kermit Balo, PA-C  clindamycin-benzoyl peroxide (BENZACLIN) gel APPLY TO AFFECTED AREA TWICE A DAY 03/23/23  Yes Tysinger, Kermit Balo, PA-C  irbesartan (AVAPRO) 150 MG tablet TAKE 1 TABLET BY MOUTH EVERY DAY 04/24/23  Yes Tysinger, Kermit Balo, PA-C  pravastatin  (PRAVACHOL) 20 MG tablet TAKE 1 TABLET BY MOUTH EVERY DAY 03/23/23  Yes Tysinger, Kermit Balo, PA-C  sildenafil (REVATIO) 20 MG tablet 3 tablets daily prn 06/30/22  Yes Tysinger, Kermit Balo, PA-C  VYZULTA 0.024 % SOLN Apply 1 drop to eye at bedtime. 11/25/21  Yes [provider]  albuterol (VENTOLIN HFA) 108 (90 Base) MCG/ACT inhaler TAKE 2 PUFFS BY MOUTH EVERY 6 HOURS AS NEEDED FOR WHEEZE OR SHORTNESS OF BREATH 04/08/22   Tysinger, Kermit Balo, PA-C  testosterone cypionate (DEPOTESTOSTERONE CYPIONATE) 200 MG/ML injection INJECT 1 ML (200 MG TOTAL) INTO THE MUSCLE EVERY 21 (TWENTY-EIGHT) DAYS. 02/09/23   Tysinger, Kermit Balo, PA-C  valACYclovir (VALTREX) 1000 MG tablet TAKE 2 TABLETS BY MOUTH TWICE A DAY FOR 2 DAYS FOR FLARE UP 04/08/22   Tysinger, Kermit Balo, PA-C    Current Outpatient Medications  Medication Sig Dispense Refill   acetaminophen (TYLENOL) 325 MG tablet Take 650 mg by mouth every 6 (six) hours as needed.     amLODipine (NORVASC) 10 MG tablet TAKE 1 TABLET BY MOUTH EVERY DAY 30 tablet 1   brimonidine-timolol (COMBIGAN) 0.2-0.5 % ophthalmic solution Place 2 drops into both eyes every 12 (twelve) hours.     Cholecalciferol (VITAMIN D3) 50 MCG (2000 UT) capsule TAKE 1 CAPSULE BY MOUTH EVERY DAY 90 capsule 0   clindamycin-benzoyl peroxide (BENZACLIN) gel APPLY TO AFFECTED AREA TWICE A DAY 25 g 0  irbesartan (AVAPRO) 150 MG tablet TAKE 1 TABLET BY MOUTH EVERY DAY 90 tablet 0   pravastatin (PRAVACHOL) 20 MG tablet TAKE 1 TABLET BY MOUTH EVERY DAY 30 tablet 1   sildenafil (REVATIO) 20 MG tablet 3 tablets daily prn 100 tablet 5   VYZULTA 0.024 % SOLN Apply 1 drop to eye at bedtime.     albuterol (VENTOLIN HFA) 108 (90 Base) MCG/ACT inhaler TAKE 2 PUFFS BY MOUTH EVERY 6 HOURS AS NEEDED FOR WHEEZE OR SHORTNESS OF BREATH 8.5 each 1   testosterone cypionate (DEPOTESTOSTERONE CYPIONATE) 200 MG/ML injection INJECT 1 ML (200 MG TOTAL) INTO THE MUSCLE EVERY 21 (TWENTY-EIGHT) DAYS. 1 mL 3   valACYclovir  (VALTREX) 1000 MG tablet TAKE 2 TABLETS BY MOUTH TWICE A DAY FOR 2 DAYS FOR FLARE UP 20 tablet 3   Current Facility-Administered Medications  Medication Dose Route Frequency Provider Last Rate Last Admin   0.9 %  sodium chloride infusion  500 mL Intravenous Continuous Sharaya Boruff, Carie Caddy, MD       testosterone cypionate (DEPOTESTOSTERONE CYPIONATE) injection 200 mg  200 mg Intramuscular Q21 days Jac Canavan, PA-C   200 mg at 03/02/23 1324   testosterone cypionate (DEPOTESTOSTERONE CYPIONATE) injection 200 mg  200 mg Intramuscular Q28 days Jac Canavan, PA-C   200 mg at 03/30/23 0830   testosterone cypionate (DEPOTESTOSTERONE CYPIONATE) injection 200 mg  200 mg Intramuscular Q21 days Jac Canavan, PA-C   200 mg at 04/27/23 0900    Allergies as of 05/04/2023 - Review Complete 05/04/2023  Allergen Reaction Noted   Penicillins Rash 01/30/2014    Family History  Problem Relation Age of Onset   Kidney disease Mother    Heart disease Mother    Hypertension Mother    Diabetes Mother    Hypertension Father    Hypertension Sister    Diabetes Sister    Hypertension Brother    Cancer Maternal Grandmother        stomach   Stomach cancer Maternal Grandmother    Cancer Maternal Grandfather        prostate   Prostate cancer Maternal Grandfather    Cancer Paternal Grandmother        ovarian   Ovarian cancer Paternal Grandmother    Stroke Paternal Grandfather    Esophageal cancer Maternal Uncle    Colon cancer Neg Hx    Colon polyps Neg Hx    Rectal cancer Neg Hx     Social History   Socioeconomic History   Marital status: Married    Spouse name: Not on file   Number of children: Not on file   Years of education: Not on file   Highest education level: Not on file  Occupational History   Not on file  Tobacco Use   Smoking status: Former    Current packs/day: 0.00    Types: Cigarettes    Quit date: 07/14/1997    Years since quitting: 25.8   Smokeless tobacco: Never   Vaping Use   Vaping status: Never Used  Substance and Sexual Activity   Alcohol use: Yes    Alcohol/week: 8.0 standard drinks of alcohol    Types: 8 Shots of liquor per week    Comment: "very littl"   Drug use: No   Sexual activity: Never  Other Topics Concern   Not on file  Social History Narrative   Lives with wife, great nephew sometimes stays with them.  Exercise - walks some.   Benna Dunks.  03/2021   Social Determinants of Health   Financial Resource Strain: Not on file  Food Insecurity: Not on file  Transportation Needs: Not on file  Physical Activity: Not on file  Stress: Not on file  Social Connections: Not on file  Intimate Partner Violence: Not on file    Physical Exam: Vital signs in last 24 hours: @BP  126/68   Pulse 79   Temp (!) 97.5 F (36.4 C)   Ht 6\' 3"  (1.905 m)   Wt 185 lb (83.9 kg)   SpO2 97%   BMI 23.12 kg/m  GEN: NAD EYE: Sclerae anicteric ENT: MMM CV: Non-tachycardic Pulm: CTA b/l GI: Soft, NT/ND NEURO:  Alert & Oriented x 3   Erick Blinks, MD [facility]  05/04/2023 1:16 PM

## 2023-05-04 NOTE — Progress Notes (Signed)
Pt's states no medical or surgical changes since previsit or office visit. 

## 2023-05-05 ENCOUNTER — Telehealth: Payer: Self-pay

## 2023-05-05 NOTE — Telephone Encounter (Signed)
  Follow up Call-     05/04/2023   12:42 PM  Call back number  Post procedure Call Back phone  # [phone]  Permission to leave phone message Yes     Patient questions:  Do you have a fever, pain , or abdominal swelling? No. Pain Score  0 *  Have you tolerated food without any problems? Yes.    Have you been able to return to your normal activities? Yes.    Do you have any questions about your discharge instructions: Diet   No. Medications  No. Follow up visit  No.  Do you have questions or concerns about your Care? No.  Actions: * If pain score is 4 or above: No action needed, pain <4.

## 2023-05-08 LAB — SURGICAL PATHOLOGY

## 2023-05-11 ENCOUNTER — Ambulatory Visit (INDEPENDENT_AMBULATORY_CARE_PROVIDER_SITE_OTHER): Payer: PRIVATE HEALTH INSURANCE | Admitting: Medical

## 2023-05-11 ENCOUNTER — Encounter: Payer: Self-pay | Admitting: Internal Medicine

## 2023-05-11 ENCOUNTER — Encounter: Payer: Self-pay | Admitting: Medical

## 2023-05-11 VITALS — BP 120/72 | HR 89 | Wt 185.4 lb

## 2023-05-11 DIAGNOSIS — D631 Anemia in chronic kidney disease: Secondary | ICD-10-CM

## 2023-05-11 DIAGNOSIS — E559 Vitamin D deficiency, unspecified: Secondary | ICD-10-CM | POA: Insufficient documentation

## 2023-05-11 DIAGNOSIS — N183 Chronic kidney disease, stage 3 unspecified: Secondary | ICD-10-CM

## 2023-05-11 DIAGNOSIS — I10 Essential (primary) hypertension: Secondary | ICD-10-CM

## 2023-05-11 DIAGNOSIS — Z87891 Personal history of nicotine dependence: Secondary | ICD-10-CM

## 2023-05-11 DIAGNOSIS — Z8249 Family history of ischemic heart disease and other diseases of the circulatory system: Secondary | ICD-10-CM

## 2023-05-11 DIAGNOSIS — Z0000 Encounter for general adult medical examination without abnormal findings: Secondary | ICD-10-CM | POA: Diagnosis not present

## 2023-05-11 DIAGNOSIS — Z23 Encounter for immunization: Secondary | ICD-10-CM | POA: Diagnosis not present

## 2023-05-11 DIAGNOSIS — N529 Male erectile dysfunction, unspecified: Secondary | ICD-10-CM

## 2023-05-11 DIAGNOSIS — Z7185 Encounter for immunization safety counseling: Secondary | ICD-10-CM

## 2023-05-11 DIAGNOSIS — Z125 Encounter for screening for malignant neoplasm of prostate: Secondary | ICD-10-CM

## 2023-05-11 DIAGNOSIS — N400 Enlarged prostate without lower urinary tract symptoms: Secondary | ICD-10-CM

## 2023-05-11 DIAGNOSIS — N189 Chronic kidney disease, unspecified: Secondary | ICD-10-CM

## 2023-05-11 DIAGNOSIS — R7989 Other specified abnormal findings of blood chemistry: Secondary | ICD-10-CM

## 2023-05-11 DIAGNOSIS — Z79899 Other long term (current) drug therapy: Secondary | ICD-10-CM

## 2023-05-11 DIAGNOSIS — Z131 Encounter for screening for diabetes mellitus: Secondary | ICD-10-CM

## 2023-05-11 DIAGNOSIS — E291 Testicular hypofunction: Secondary | ICD-10-CM

## 2023-05-11 DIAGNOSIS — E785 Hyperlipidemia, unspecified: Secondary | ICD-10-CM

## 2023-05-11 NOTE — Progress Notes (Signed)
Subjective:   HPI  Shawn Pena is a 57 y.o. male who presents for Chief Complaint  Patient presents with   Medical Management of Chronic Issues    Med check. Declines flu, covid    Patient Care Team: Delva Derden, Cleda Mccreedy as PCP - General (Family Medicine) Sees dentist Sees eye doctor Dr. Bufford Buttner , formerly Dr. Elvis Coil, nephrology Dr. Erick Blinks Dr Elvis Coil, nephrology  Concerns:  Hypertension - compliant with medications, no c/o.  No edema  Hyperlipidemia - compliant with medicaiton without c/o  Low testosterone - coming in every 3 weeks for TST injections.  Doing fine on this  Sees eye doctor for glaucoma  Compliant with vit D supplement  Saw nephrology for routine f/u  03/2023  Sees dentist today  He is nonfasting today   Reviewed their medical, surgical, family, social, medication, and allergy history and updated chart as appropriate.  Past Medical History:  Diagnosis Date   Erectile dysfunction    Genital herpes    Glaucoma    Hyperlipidemia    Hypertension    Hypogonadism male    Low testosterone     Past Surgical History:  Procedure Laterality Date   COLONOSCOPY  10/2017   Tubular Adenoma, repeat 5 years, Dr. Erick Blinks   THROAT SURGERY     vocal cord polyp   WISDOM TOOTH EXTRACTION      Family History  Problem Relation Age of Onset   Kidney disease Mother    Heart disease Mother    Hypertension Mother    Diabetes Mother    Hypertension Father    Hypertension Sister    Diabetes Sister    Hypertension Brother    Cancer Maternal Grandmother        stomach   Stomach cancer Maternal Grandmother    Cancer Maternal Grandfather        prostate   Prostate cancer Maternal Grandfather    Cancer Paternal Grandmother        ovarian   Ovarian cancer Paternal Grandmother    Stroke Paternal Grandfather    Esophageal cancer Maternal Uncle    Colon cancer Neg Hx    Colon polyps Neg Hx    Rectal cancer Neg Hx      Current  Outpatient Medications:    acetaminophen (TYLENOL) 325 MG tablet, Take 650 mg by mouth every 6 (six) hours as needed., Disp: , Rfl:    albuterol (VENTOLIN HFA) 108 (90 Base) MCG/ACT inhaler, TAKE 2 PUFFS BY MOUTH EVERY 6 HOURS AS NEEDED FOR WHEEZE OR SHORTNESS OF BREATH, Disp: 8.5 each, Rfl: 1   amLODipine (NORVASC) 10 MG tablet, TAKE 1 TABLET BY MOUTH EVERY DAY, Disp: 30 tablet, Rfl: 1   brimonidine-timolol (COMBIGAN) 0.2-0.5 % ophthalmic solution, Place 2 drops into both eyes every 12 (twelve) hours., Disp: , Rfl:    Cholecalciferol (VITAMIN D3) 50 MCG (2000 UT) capsule, TAKE 1 CAPSULE BY MOUTH EVERY DAY, Disp: 90 capsule, Rfl: 0   clindamycin-benzoyl peroxide (BENZACLIN) gel, APPLY TO AFFECTED AREA TWICE A DAY, Disp: 25 g, Rfl: 0   irbesartan (AVAPRO) 150 MG tablet, TAKE 1 TABLET BY MOUTH EVERY DAY, Disp: 90 tablet, Rfl: 0   pravastatin (PRAVACHOL) 20 MG tablet, TAKE 1 TABLET BY MOUTH EVERY DAY, Disp: 30 tablet, Rfl: 1   sildenafil (REVATIO) 20 MG tablet, 3 tablets daily prn, Disp: 100 tablet, Rfl: 5   testosterone cypionate (DEPOTESTOSTERONE CYPIONATE) 200 MG/ML injection, INJECT 1 ML (200 MG TOTAL) INTO  THE MUSCLE EVERY 21 (TWENTY-EIGHT) DAYS., Disp: 1 mL, Rfl: 3   valACYclovir (VALTREX) 1000 MG tablet, TAKE 2 TABLETS BY MOUTH TWICE A DAY FOR 2 DAYS FOR FLARE UP, Disp: 20 tablet, Rfl: 3   VYZULTA 0.024 % SOLN, Apply 1 drop to eye at bedtime., Disp: , Rfl:   Allergies  Allergen Reactions   Penicillins Rash   Review of Systems  Constitutional:  Negative for chills, fever, malaise/fatigue and weight loss.  HENT:  Negative for congestion, ear pain, hearing loss, sore throat and tinnitus.   Eyes:  Negative for blurred vision, pain and redness.  Respiratory:  Negative for cough, hemoptysis and shortness of breath.   Cardiovascular:  Negative for chest pain, palpitations, orthopnea, claudication and leg swelling.  Gastrointestinal:  Negative for abdominal pain, blood in stool, constipation,  diarrhea, nausea and vomiting.  Genitourinary:  Negative for dysuria, flank pain, frequency, hematuria and urgency.  Musculoskeletal:  Negative for falls, joint pain and myalgias.  Skin:  Negative for itching and rash.  Neurological:  Negative for dizziness, tingling, speech change, weakness and headaches.  Endo/Heme/Allergies:  Negative for polydipsia. Does not bruise/bleed easily.  Psychiatric/Behavioral:  Negative for depression and memory loss. The patient is not nervous/anxious and does not have insomnia.         05/11/2023    8:56 AM 04/07/2022    1:50 PM 11/20/2021    1:30 PM 04/01/2021    3:07 PM 01/23/2020    2:09 PM  Depression screen PHQ 2/9  Decreased Interest 0 0 0 0 0  Down, Depressed, Hopeless 0 0 0 0 0  PHQ - 2 Score 0 0 0 0 0        Objective:  BP 120/72   Pulse 89   Wt 185 lb 6.4 oz (84.1 kg)   BMI 23.17 kg/m   General appearance: alert, no distress, WD/WN, African American male Skin: unremarkable HEENT: normocephalic, conjunctiva/corneas normal, sclerae anicteric, PERRLA, EOMi Neck: supple, no lymphadenopathy, no thyromegaly, no masses, normal ROM, no bruits Chest: non tender, normal shape and expansion Heart: RRR, normal S1, S2, no murmurs Lungs: CTA bilaterally, no wheezes, rhonchi, or rales Abdomen: +bs, soft, non tender, non distended, no masses, no hepatomegaly, no splenomegaly, no bruits Back: non tender, normal ROM, no scoliosis Musculoskeletal: upper extremities non tender, no obvious deformity, normal ROM throughout, lower extremities non tender, no obvious deformity, normal ROM throughout Extremities: no edema, no cyanosis, no clubbing Pulses: 2+ symmetric, upper and lower extremities, normal cap refill Neurological: alert, oriented x 3, CN2-12 intact, strength normal upper extremities and lower extremities, sensation normal throughout, DTRs 2+ throughout, no cerebellar signs, gait normal Psychiatric: normal affect, behavior normal, pleasant   GU/rectal -normal GU, anus normal tone, prostate mildly enlarged, no obvious nodules     Assessment and Plan :   Encounter Diagnoses  Name Primary?   Encounter for health maintenance examination in adult Yes   Vaccine counseling    Need for shingles vaccine    Stage 3 chronic kidney disease, unspecified whether stage 3a or 3b CKD (HCC)    Screening for prostate cancer    Low testosterone    Hypogonadism in male    Hyperlipidemia, unspecified hyperlipidemia type    Anemia of renal disease    Benign prostatic hyperplasia without lower urinary tract symptoms    Erectile dysfunction, unspecified erectile dysfunction type    Essential hypertension, benign    Former smoker    High risk medication use  Vitamin D deficiency    Screening for diabetes mellitus    Family history of premature coronary artery disease     This visit was a preventative care visit, also known as wellness visit or routine physical.   Topics typically include healthy lifestyle, diet, exercise, preventative care, vaccinations, sick and well care, proper use of emergency dept and after hours care, as well as other concerns.     Recommendations: Continue to return yearly for your annual wellness and preventative care visits.  This gives [place] a chance to discuss healthy lifestyle, exercise, vaccinations, review your chart record, and perform screenings where appropriate.  I recommend you see your eye doctor yearly for routine vision care.  I recommend you see your dentist yearly for routine dental care including hygiene visits twice yearly.   Vaccination recommendations were reviewed Immunization History  Administered Date(s) Administered   Influenza,inj,Quad PF,6+ Mos 06/04/2020, 04/01/2021, 06/30/2022   PFIZER(Purple Top)SARS-COV-2 Vaccination 10/09/2019, 10/30/2019, 06/04/2020   Pneumococcal Polysaccharide-23 04/07/2022   Tdap 12/14/2017   Zoster Recombinant(Shingrix) 05/11/2023    Counseled on the  Shingrix vaccine.  Vaccine information sheet given. Shingrix #1 vaccine given after consent obtained.   Return in 2 months for Shingrix #2.  Counseled on flu and covid vaccine.  He declines for now    Screening for cancer: Colon cancer screening: I reviewed his recent 04/2023 colonoscopy report done last week  We discussed PSA, prostate exam, and prostate cancer screening risks/benefits.     Skin cancer screening: Check your skin regularly for new changes, growing lesions, or other lesions of concern Come in for evaluation if you have skin lesions of concern.  Lung cancer screening: If you have a greater than 20 pack year history of tobacco use, then you may qualify for lung cancer screening with a chest CT scan.   Please call your insurance company to inquire about coverage for this test.  We currently don't have screenings for other cancers besides breast, cervical, colon, and lung cancers.  If you have a strong family history of cancer or have other cancer screening concerns, please let me know.    Bone health: Get at least 150 minutes of aerobic exercise weekly Get weight bearing exercise at least once weekly Bone density test:  A bone density test is an imaging test that uses a type of X-ray to measure the amount of calcium and other minerals in your bones. The test may be used to diagnose or screen you for a condition that causes weak or thin bones (osteoporosis), predict your risk for a broken bone (fracture), or determine how well your osteoporosis treatment is working. The bone density test is recommended for females 65 and older, or females or males <65 if certain risk factors such as thyroid disease, long term use of steroids such as for asthma or rheumatological issues, vitamin D deficiency, estrogen deficiency, family history of osteoporosis, self or family history of fragility fracture in first degree relative.    Heart health: Get at least 150 minutes of aerobic  exercise weekly Limit alcohol It is important to maintain a healthy blood pressure and healthy cholesterol numbers  Heart disease screening: Screening for heart disease includes screening for blood pressure, fasting lipids, glucose/diabetes screening, BMI height to weight ratio, reviewed of smoking status, physical activity, and diet.    Goals include blood pressure 120/80 or less, maintaining a healthy lipid/cholesterol profile, preventing diabetes or keeping diabetes numbers under good control, not smoking or using tobacco products, exercising most  days per week or at least 150 minutes per week of exercise, and eating healthy variety of fruits and vegetables, healthy oils, and avoiding unhealthy food choices like fried food, fast food, high sugar and high cholesterol foods.    Other tests may possibly include EKG test, CT coronary calcium score, echocardiogram, exercise treadmill stress test.    Referral to cardiology for consult given family history    Medical care options: I recommend you continue to seek care here first for routine care.  We try really hard to have available appointments Monday through Friday daytime hours for sick visits, acute visits, and physicals.  Urgent care should be used for after hours and weekends for significant issues that cannot wait till the next day.  The emergency department should be used for significant potentially life-threatening emergencies.  The emergency department is expensive, can often have long wait times for less significant concerns, so try to utilize primary care, urgent care, or telemedicine when possible to avoid unnecessary trips to the emergency department.  Virtual visits and telemedicine have been introduced since the pandemic started in 2020, and can be convenient ways to receive medical care.  We offer virtual appointments as well to assist you in a variety of options to seek medical care.    Separate significant issues  discussed: Hypertension-compliant with medications, routine labs today  Low testosterone-compliant with testosterone injections, routine labs including PSA screening today.     Hyperlipidemia-continue statin, routine labs today  Glaucoma-follow-up with eye doctor as scheduled  CKD 3-sees nephrology, reviewed 03/2023 notes and labs  Vit D deficiency - continue supplement, labs today   Return for fasting labs as he is nonfasting today    [place] was seen today for medical management of chronic issues.  Diagnoses and all orders for this visit:  Encounter for health maintenance examination in adult -     CBC with Differential/Platelet; Future -     PSA; Future -     Lipid panel; Future -     Hepatic function panel; Future -     Testosterone; Future -     Hemoglobin A1c; Future -     VITAMIN D 25 Hydroxy (Vit-D Deficiency, Fractures); Future -     Ambulatory referral to Cardiology  Vaccine counseling -     Zoster Recombinant (Shingrix )  Need for shingles vaccine -     Zoster Recombinant (Shingrix )  Stage 3 chronic kidney disease, unspecified whether stage 3a or 3b CKD (HCC) -     Ambulatory referral to Cardiology  Screening for prostate cancer -     PSA; Future  Low testosterone -     Testosterone; Future  Hypogonadism in male -     Testosterone; Future  Hyperlipidemia, unspecified hyperlipidemia type -     Lipid panel; Future -     Hepatic function panel; Future -     Ambulatory referral to Cardiology  Anemia of renal disease  Benign prostatic hyperplasia without lower urinary tract symptoms -     PSA; Future  Erectile dysfunction, unspecified erectile dysfunction type  Essential hypertension, benign -     Ambulatory referral to Cardiology  Former smoker  High risk medication use  Vitamin D deficiency -     VITAMIN D 25 Hydroxy (Vit-D Deficiency, Fractures); Future  Screening for diabetes mellitus -     Hemoglobin A1c; Future  Family history of  premature coronary artery disease -     Ambulatory referral to Cardiology  Follow-up pending labs, yearly for physical

## 2023-05-11 NOTE — Patient Instructions (Signed)
This visit was a preventative care visit, also known as wellness visit or routine physical.   Topics typically include healthy lifestyle, diet, exercise, preventative care, vaccinations, sick and well care, proper use of emergency dept and after hours care, as well as other concerns.     Recommendations: Continue to return yearly for your annual wellness and preventative care visits.  This gives [place] a chance to discuss healthy lifestyle, exercise, vaccinations, review your chart record, and perform screenings where appropriate.  I recommend you see your eye doctor yearly for routine vision care.  I recommend you see your dentist yearly for routine dental care including hygiene visits twice yearly.   Vaccination recommendations were reviewed Immunization History  Administered Date(s) Administered   Influenza,inj,Quad PF,6+ Mos 06/04/2020, 04/01/2021, 06/30/2022   PFIZER(Purple Top)SARS-COV-2 Vaccination 10/09/2019, 10/30/2019, 06/04/2020   Pneumococcal Polysaccharide-23 04/07/2022   Tdap 12/14/2017    Counseled on the Shingrix vaccine.  Vaccine information sheet given. Shingrix #1 vaccine given after consent obtained.   Return in 2 months for Shingrix #2.  Counseled on flu and covid vaccine.  He declines for now    Screening for cancer: Colon cancer screening: I reviewed his recent 04/2023 colonoscopy report done last week  We discussed PSA, prostate exam, and prostate cancer screening risks/benefits.     Skin cancer screening: Check your skin regularly for new changes, growing lesions, or other lesions of concern Come in for evaluation if you have skin lesions of concern.  Lung cancer screening: If you have a greater than 20 pack year history of tobacco use, then you may qualify for lung cancer screening with a chest CT scan.   Please call your insurance company to inquire about coverage for this test.  We currently don't have screenings for other cancers besides breast,  cervical, colon, and lung cancers.  If you have a strong family history of cancer or have other cancer screening concerns, please let me know.    Bone health: Get at least 150 minutes of aerobic exercise weekly Get weight bearing exercise at least once weekly Bone density test:  A bone density test is an imaging test that uses a type of X-ray to measure the amount of calcium and other minerals in your bones. The test may be used to diagnose or screen you for a condition that causes weak or thin bones (osteoporosis), predict your risk for a broken bone (fracture), or determine how well your osteoporosis treatment is working. The bone density test is recommended for females 65 and older, or females or males <65 if certain risk factors such as thyroid disease, long term use of steroids such as for asthma or rheumatological issues, vitamin D deficiency, estrogen deficiency, family history of osteoporosis, self or family history of fragility fracture in first degree relative.    Heart health: Get at least 150 minutes of aerobic exercise weekly Limit alcohol It is important to maintain a healthy blood pressure and healthy cholesterol numbers  Heart disease screening: Screening for heart disease includes screening for blood pressure, fasting lipids, glucose/diabetes screening, BMI height to weight ratio, reviewed of smoking status, physical activity, and diet.    Goals include blood pressure 120/80 or less, maintaining a healthy lipid/cholesterol profile, preventing diabetes or keeping diabetes numbers under good control, not smoking or using tobacco products, exercising most days per week or at least 150 minutes per week of exercise, and eating healthy variety of fruits and vegetables, healthy oils, and avoiding unhealthy food choices like fried food,  fast food, high sugar and high cholesterol foods.    Other tests may possibly include EKG test, CT coronary calcium score, echocardiogram, exercise  treadmill stress test.    Referral to cardiology for consult given family history    Medical care options: I recommend you continue to seek care here first for routine care.  We try really hard to have available appointments Monday through Friday daytime hours for sick visits, acute visits, and physicals.  Urgent care should be used for after hours and weekends for significant issues that cannot wait till the next day.  The emergency department should be used for significant potentially life-threatening emergencies.  The emergency department is expensive, can often have long wait times for less significant concerns, so try to utilize primary care, urgent care, or telemedicine when possible to avoid unnecessary trips to the emergency department.  Virtual visits and telemedicine have been introduced since the pandemic started in 2020, and can be convenient ways to receive medical care.  We offer virtual appointments as well to assist you in a variety of options to seek medical care.    Separate significant issues discussed: Hypertension-compliant with medications, routine labs today  Low testosterone-compliant with testosterone injections, routine labs including PSA screening today.     Hyperlipidemia-continue statin, routine labs today  Glaucoma-follow-up with eye doctor as scheduled  CKD 3-I reviewed nephrology notes from 01/2022 showing anemia of renal disease was stable at that time, CKD 3 for sclerosis, stable renal function at that time  Vit D deficiency - continue supplement, labs today   Return for fasting labs as he is nonfasting today

## 2023-05-18 ENCOUNTER — Other Ambulatory Visit: Payer: PRIVATE HEALTH INSURANCE

## 2023-05-18 DIAGNOSIS — E559 Vitamin D deficiency, unspecified: Secondary | ICD-10-CM

## 2023-05-18 DIAGNOSIS — E291 Testicular hypofunction: Secondary | ICD-10-CM

## 2023-05-18 DIAGNOSIS — Z125 Encounter for screening for malignant neoplasm of prostate: Secondary | ICD-10-CM

## 2023-05-18 DIAGNOSIS — Z0000 Encounter for general adult medical examination without abnormal findings: Secondary | ICD-10-CM

## 2023-05-18 DIAGNOSIS — R7989 Other specified abnormal findings of blood chemistry: Secondary | ICD-10-CM

## 2023-05-18 DIAGNOSIS — N400 Enlarged prostate without lower urinary tract symptoms: Secondary | ICD-10-CM

## 2023-05-18 DIAGNOSIS — E785 Hyperlipidemia, unspecified: Secondary | ICD-10-CM

## 2023-05-18 DIAGNOSIS — Z131 Encounter for screening for diabetes mellitus: Secondary | ICD-10-CM

## 2023-05-19 ENCOUNTER — Other Ambulatory Visit: Payer: Self-pay | Admitting: Medical

## 2023-05-19 LAB — HEPATIC FUNCTION PANEL
ALT: 14 [IU]/L (ref 0–44)
AST: 17 IU/L (ref 0–40)
Albumin: 4.4 g/dL (ref 3.8–4.9)
Alkaline Phosphatase: 64 [IU]/L (ref 44–121)
Bilirubin Total: 0.5 mg/dL (ref 0.0–1.2)
Bilirubin, Direct: 0.15 mg/dL (ref 0.00–0.40)
Total Protein: 7.4 g/dL (ref 6.0–8.5)

## 2023-05-19 LAB — CBC WITH DIFFERENTIAL/PLATELET
Basophils Absolute: 0.1 10*3/uL (ref 0.0–0.2)
Basos: 1 %
EOS (ABSOLUTE): 0.2 10*3/uL (ref 0.0–0.4)
Eos: 3 %
Hematocrit: 42.4 % (ref 37.5–51.0)
Hemoglobin: 13.7 g/dL (ref 13.0–17.7)
Immature Grans (Abs): 0 10*3/uL (ref 0.0–0.1)
Immature Granulocytes: 0 %
Lymphocytes Absolute: 2.5 10*3/uL (ref 0.7–3.1)
Lymphs: 35 %
MCH: 29.7 pg (ref 26.6–33.0)
MCHC: 32.3 g/dL (ref 31.5–35.7)
MCV: 92 fL (ref 79–97)
Monocytes Absolute: 0.7 10*3/uL (ref 0.1–0.9)
Monocytes: 9 %
Neutrophils Absolute: 3.7 10*3/uL (ref 1.4–7.0)
Neutrophils: 52 %
Platelets: 196 10*3/uL (ref 150–450)
RBC: 4.61 x10E6/uL (ref 4.14–5.80)
RDW: 12.3 % (ref 11.6–15.4)
WBC: 7.1 10*3/uL (ref 3.4–10.8)

## 2023-05-19 LAB — TESTOSTERONE: Testosterone: 177 ng/dL — ABNORMAL LOW (ref 264–916)

## 2023-05-19 LAB — LIPID PANEL
Chol/HDL Ratio: 3.2 ratio (ref 0.0–5.0)
Cholesterol, Total: 139 mg/dL (ref 100–199)
HDL: 43 mg/dL (ref >39)
LDL Chol Calc (NIH): 82 mg/dL (ref 0–99)
Triglycerides: 68 mg/dL (ref 0–149)
VLDL Cholesterol Cal: 14 mg/dL (ref 5–40)

## 2023-05-19 LAB — PSA: Prostate Specific Ag, Serum: 1.9 ng/mL (ref 0.0–4.0)

## 2023-05-19 LAB — HEMOGLOBIN A1C
Est. average glucose Bld gHb Est-mCnc: 131 mg/dL
Hgb A1c MFr Bld: 6.2 % — ABNORMAL HIGH (ref 4.8–5.6)

## 2023-05-19 LAB — VITAMIN D 25 HYDROXY (VIT D DEFICIENCY, FRACTURES): Vit D, 25-Hydroxy: 54.3 ng/mL (ref 30.0–100.0)

## 2023-05-19 MED ORDER — PRAVASTATIN SODIUM 20 MG PO TABS: 20.0000 mg | ORAL_TABLET | Freq: Every day | ORAL | 3 refills | Status: DC

## 2023-05-19 MED ORDER — TESTOSTERONE CYPIONATE 200 MG/ML IM SOLN: INTRAMUSCULAR | 1 refills | Status: DC

## 2023-05-19 MED ORDER — VITAMIN D3 50 MCG (2000 UT) PO CAPS: 2000.0000 [IU] | ORAL_CAPSULE | Freq: Every day | ORAL | 3 refills | Status: DC

## 2023-05-19 MED ORDER — SILDENAFIL CITRATE 20 MG PO TABS: ORAL_TABLET | ORAL | 5 refills | Status: AC

## 2023-05-19 MED ORDER — IRBESARTAN 150 MG PO TABS: 150.0000 mg | ORAL_TABLET | Freq: Every day | ORAL | 3 refills | Status: DC

## 2023-05-19 MED ORDER — VALACYCLOVIR HCL 1 G PO TABS: ORAL_TABLET | ORAL | 3 refills | Status: AC

## 2023-05-19 MED ORDER — AMLODIPINE BESYLATE 10 MG PO TABS: 10.0000 mg | ORAL_TABLET | Freq: Every day | ORAL | 3 refills | Status: DC

## 2023-05-19 NOTE — Progress Notes (Signed)
Results sent through MyChart

## 2023-05-24 ENCOUNTER — Other Ambulatory Visit: Payer: Self-pay | Admitting: Medical

## 2023-05-25 ENCOUNTER — Other Ambulatory Visit (INDEPENDENT_AMBULATORY_CARE_PROVIDER_SITE_OTHER): Payer: PRIVATE HEALTH INSURANCE

## 2023-05-25 DIAGNOSIS — R7989 Other specified abnormal findings of blood chemistry: Secondary | ICD-10-CM | POA: Diagnosis not present

## 2023-05-25 MED ORDER — TESTOSTERONE CYPIONATE 200 MG/ML IM SOLN
200.0000 mg | INTRAMUSCULAR | Status: DC
  Administered 2023-05-25: 200 mg via INTRAMUSCULAR

## 2023-06-22 ENCOUNTER — Other Ambulatory Visit (INDEPENDENT_AMBULATORY_CARE_PROVIDER_SITE_OTHER): Payer: PRIVATE HEALTH INSURANCE

## 2023-06-22 DIAGNOSIS — E291 Testicular hypofunction: Secondary | ICD-10-CM

## 2023-06-22 MED ORDER — TESTOSTERONE CYPIONATE 200 MG/ML IM SOLN
200.0000 mg | INTRAMUSCULAR | Status: DC
  Administered 2023-06-22: 200 mg via INTRAMUSCULAR

## 2023-07-06 ENCOUNTER — Ambulatory Visit: Payer: PRIVATE HEALTH INSURANCE | Attending: Cardiovascular Disease | Admitting: Cardiovascular Disease

## 2023-07-13 ENCOUNTER — Ambulatory Visit (INDEPENDENT_AMBULATORY_CARE_PROVIDER_SITE_OTHER): Payer: PRIVATE HEALTH INSURANCE | Admitting: Medical

## 2023-07-13 VITALS — BP 120/80 | HR 93 | Wt 184.6 lb

## 2023-07-13 DIAGNOSIS — R7303 Prediabetes: Secondary | ICD-10-CM

## 2023-07-13 DIAGNOSIS — N400 Enlarged prostate without lower urinary tract symptoms: Secondary | ICD-10-CM

## 2023-07-13 DIAGNOSIS — R7989 Other specified abnormal findings of blood chemistry: Secondary | ICD-10-CM

## 2023-07-13 DIAGNOSIS — Z23 Encounter for immunization: Secondary | ICD-10-CM

## 2023-07-13 DIAGNOSIS — E291 Testicular hypofunction: Secondary | ICD-10-CM | POA: Diagnosis not present

## 2023-07-13 MED ORDER — TESTOSTERONE CYPIONATE 200 MG/ML IM SOLN
200.0000 mg | Freq: Once | INTRAMUSCULAR | Status: AC
  Administered 2023-07-13: 200 mg via INTRAMUSCULAR

## 2023-07-13 NOTE — Progress Notes (Signed)
Subjective:  Shawn Pena is a 57 y.o. male who presents for Chief Complaint  Patient presents with   Medical Management of Chronic Issues    2 month follow-up, testosterone injection today but 3 weeks ago, # 2 shingles     Low testosterone, hypogonadism-here for testosterone injection IM.  He sometimes feels low energy but overall the medication seems to work fine for him.  He has prior been on topical gel as well  Here for Shingrix No. 2  Labs from last visit showed prediabetes.  He does drink some ginger ale and eat some sweets  No other aggravating or relieving factors.    No other c/o.   The following portions of the patient's history were reviewed and updated as appropriate: allergies, current medications, past family history, past medical history, past social history, past surgical history and problem list.  ROS Otherwise as in subjective above    Objective: BP 120/80   Pulse 93   Wt 184 lb 9.6 oz (83.7 kg)   BMI 23.07 kg/m   General appearance: alert, no distress, well developed, well nourished    Assessment: Encounter Diagnoses  Name Primary?   Hypogonadism in male Yes   Low testosterone    Benign prostatic hyperplasia without lower urinary tract symptoms    Need for shingles vaccine    Prediabetes      Plan: Hypogonadism, low testosterone, BPH Doing fine on IM injections of testosterone every 3 weeks.  I reviewed his recent labs. We did discuss other options for testosterone therapy such as oral, weekly subcutaneous, and topical gels together.  He has been on IM injections and topical gels before.  For now he will continue current regimen  BPH-no specific complaints.  No adverse reactions from the testosterone therapy  Prediabetes-counseled on diet and exercise and efforts to prevent progression to diabetes  Counseled on the Shingrix vaccine.  Vaccine information sheet given. Shingrix #2 vaccine given after consent obtained.     Shawn Pena was seen  today for medical management of chronic issues.  Diagnoses and all orders for this visit:  Hypogonadism in male -     testosterone cypionate (DEPOTESTOSTERONE CYPIONATE) injection 200 mg  Low testosterone -     testosterone cypionate (DEPOTESTOSTERONE CYPIONATE) injection 200 mg  Benign prostatic hyperplasia without lower urinary tract symptoms  Need for shingles vaccine -     Zoster Recombinant (Shingrix )  Prediabetes    Follow up: 20mo

## 2023-08-03 ENCOUNTER — Other Ambulatory Visit (INDEPENDENT_AMBULATORY_CARE_PROVIDER_SITE_OTHER): Payer: PRIVATE HEALTH INSURANCE

## 2023-08-03 DIAGNOSIS — R7989 Other specified abnormal findings of blood chemistry: Secondary | ICD-10-CM | POA: Diagnosis not present

## 2023-08-03 DIAGNOSIS — E291 Testicular hypofunction: Secondary | ICD-10-CM | POA: Diagnosis not present

## 2023-08-03 MED ORDER — TESTOSTERONE CYPIONATE 200 MG/ML IM SOLN
200.0000 mg | INTRAMUSCULAR | Status: DC
  Administered 2023-08-03: 200 mg via INTRAMUSCULAR

## 2023-08-19 ENCOUNTER — Ambulatory Visit: Payer: PRIVATE HEALTH INSURANCE | Admitting: Medical

## 2023-08-19 VITALS — BP 124/70 | HR 74 | Temp 97.6°F | Wt 186.4 lb

## 2023-08-19 DIAGNOSIS — L02811 Cutaneous abscess of head [any part, except face]: Secondary | ICD-10-CM | POA: Diagnosis not present

## 2023-08-19 MED ORDER — DOXYCYCLINE HYCLATE 100 MG PO TABS: 100.0000 mg | ORAL_TABLET | Freq: Two times a day (BID) | ORAL | 0 refills | Status: DC

## 2023-08-19 MED ORDER — HYDROCODONE-ACETAMINOPHEN 5-325 MG PO TABS: 1.0000 | ORAL_TABLET | Freq: Four times a day (QID) | ORAL | 0 refills | Status: DC | PRN

## 2023-08-19 NOTE — Patient Instructions (Signed)
 Recommendations: Use some hot bath soaks with soapy water over the next few days You may still get little drainage from the abscess so put a towel over your pillow and keep some hand towels with you during the day just in case Begin doxycycline  antibiotic twice a day for a week You can use over-the-counter pain medicine or for worse pain you can use the hydrocodone  I sent to the pharmacy today.  Caution with sedation with this medicine. Symptoms should gradually resolve over the next few days If getting worse again or new concerns, then recheck  Skin Abscess  A skin abscess is an infected spot of skin. It can have pus in it. An abscess can happen in any part of your body. Some abscesses break open (rupture) on their own. Most keep getting worse unless they are treated. If your abscess is not treated, the infection can spread deeper into your body and blood. This can make you feel sick. What are the causes? Germs that enter your skin. This may happen if you have: A cut or scrape. A wound from a needle or an insect bite. Blocked oil or sweat glands. A problem with the spot where your hair goes into your skin. A fluid-filled sac called a cyst under your skin. What increases the risk? Having problems with how your blood moves through your body. Having a weak body defense system (immune system). Having diabetes. Having dry and irritated skin. Needing to get shots often. Putting drugs into your body with a needle. Having a splinter or something else in your skin. Smoking. What are the signs or symptoms? A firm bump under your skin that hurts. A bump with pus at the top. Redness and swelling. Warm or tender spots. A sore on the skin. How is this treated? You may need to: Put a heat pack or a warm, wet washcloth on the spot. Have the pus drained. Take antibiotics. Follow these instructions at home: Medicines Take over-the-counter and prescription medicines only as told by your  doctor. If you were prescribed antibiotics, take them as told by your doctor. Do not stop taking them even if you start to feel better. Abscess care  If you have an abscess that has not drained, put heat on it. Use the heat source that your doctor recommends, such as a moist heat pack or a heating pad. Place a towel between your skin and the heat source. Leave the heat on for 20-30 minutes. If your skin turns bright red, take off the heat right away to prevent burns. The risk of burns is higher if you cannot feel pain, heat, or cold. Follow instructions from your doctor about how to take care of your abscess. Make sure you: Cover the abscess with a bandage. Wash your hands with soap and water for at least 20 seconds before and after you change your bandage. If you cannot use soap and water, use hand sanitizer. Change your bandage as told by your doctor. Check your abscess every day for signs that the infection is getting worse. Check for: More redness, swelling, or pain. More fluid or blood. Warmth. More pus or a worse smell. General instructions To keep the infection from spreading: Do not share personal items or towels. Do not go in a hot tub with others. Avoid making skin contact with others. Be careful when you get rid of used bandages or any pus from the abscess. Do not smoke or use any products that contain nicotine or tobacco. If  you need help quitting, ask your doctor. Contact a doctor if: You see red streaks on your skin near the abscess. You have any signs of worse infection. You vomit every time you eat or drink. You have a fever, chills, or muscle aches. The cyst or abscess comes back. Get help right away if: You have very bad pain. You make less pee (urine) than normal. This information is not intended to replace advice given to you by your health care provider. Make sure you discuss any questions you have with your health care provider. Document Revised: 02/12/2022  Document Reviewed: 02/12/2022 Elsevier Patient Education  2024 [employer].

## 2023-08-19 NOTE — Progress Notes (Signed)
 Subjective:   Shawn Pena is a 58 y.o. male who presents for evaluation of a probable cutaneous abscess. Lesion is located in the back scalp.  Onset was 7 days ago. Symptoms have gradually worsened.  Abscess has associated symptoms of pain.   Patient does not have previous history of cutaneous abscesses.   Patient denies hx/o MRSA.  Patient denies hx/o I&D for similar.  Patient does not have diabetes.  Patient denies hx/o poor wound healing, compromised immunity or HIV.  No other aggravating or relieving factors.  No other c/o.  Past Medical History:  Diagnosis Date   Erectile dysfunction    Genital herpes    Glaucoma    Hyperlipidemia    Hypertension    Hypogonadism male    Low testosterone     Current Outpatient Medications on File Prior to Visit  Medication Sig Dispense Refill   acetaminophen  (TYLENOL ) 325 MG tablet Take 650 mg by mouth every 6 (six) hours as needed.     albuterol  (VENTOLIN  HFA) 108 (90 Base) MCG/ACT inhaler TAKE 2 PUFFS BY MOUTH EVERY 6 HOURS AS NEEDED FOR WHEEZE OR SHORTNESS OF BREATH 8.5 each 1   amLODipine  (NORVASC ) 10 MG tablet Take 1 tablet (10 mg total) by mouth daily. 90 tablet 3   brimonidine-timolol (COMBIGAN) 0.2-0.5 % ophthalmic solution Place 2 drops into both eyes every 12 (twelve) hours.     Cholecalciferol (VITAMIN D3) 50 MCG (2000 UT) capsule Take 1 capsule (2,000 Units total) by mouth daily. 90 capsule 3   clindamycin -benzoyl peroxide (BENZACLIN) gel APPLY TO AFFECTED AREA TWICE A DAY 25 g 0   irbesartan  (AVAPRO ) 150 MG tablet Take 1 tablet (150 mg total) by mouth daily. 90 tablet 3   pravastatin  (PRAVACHOL ) 20 MG tablet Take 1 tablet (20 mg total) by mouth daily. 90 tablet 3   sildenafil  (REVATIO ) 20 MG tablet 3 tablets daily prn 100 tablet 5   testosterone  cypionate (DEPOTESTOSTERONE CYPIONATE) 200 MG/ML injection INJECT 1 ML (200 MG TOTAL) INTO THE MUSCLE EVERY 21 (TWENTY-EIGHT) DAYS. 10 mL 1   valACYclovir  (VALTREX ) 1000 MG tablet TAKE 2  TABLETS BY MOUTH TWICE A DAY FOR 2 DAYS FOR FLARE UP 20 tablet 3   VYZULTA 0.024 % SOLN Apply 1 drop to eye at bedtime.     Current Facility-Administered Medications on File Prior to Visit  Medication Dose Route Frequency Provider Last Rate Last Admin   testosterone  cypionate (DEPOTESTOSTERONE CYPIONATE) injection 200 mg  200 mg Intramuscular Q28 days Bulah Alm RAMAN, PA-C   200 mg at 08/03/23 1316    Reviewed prior allergies, medications, past medical history, past surgical history.  ROS as in subjective   Objective:   BP 124/70   Pulse 74   Temp 97.6 F (36.4 C)   Wt 186 lb 6.4 oz (84.6 kg)   BMI 23.30 kg/m   Gen: wd, wn, nad Skin: There is an area characterized by a subcutaneous mass consistent with a cutaneous abscess measuring 2 cm in greatest dimension. Location: back of scalp.    Assessment:   Encounter Diagnosis  Name Primary?   Cutaneous abscess of head excluding face Yes      Plan:   Discussed examination findings, diagnosis, usual course of illness, and options for therapy discussed. After discussing recommendations, patient agrees to I&D, oral antibiotics.    Procedure Informed consent obtained.  The area was prepped in the usual manner and the skin overlying the abscess was anesthetized with 1cc of 1% lidocaine with  epinephrine.  The area was sharply incised and approx 3ccs of purulent material was obtained.  Area was irrigated with high pressure saline. Packing was not inserted. Wound was covered with sterile bandage.    Advised patient to complete the course of oral antibiotics, use warm compresses or heat applied to the area to promote drainage.  Shawn Pena was seen today for bump on back of head.  Diagnoses and all orders for this visit:  Cutaneous abscess of head excluding face  Other orders -     HYDROcodone -acetaminophen  (NORCO) 5-325 MG tablet; Take 1 tablet by mouth every 6 (six) hours as needed. -     doxycycline  (VIBRA -TABS) 100 MG tablet; Take  1 tablet (100 mg total) by mouth 2 (two) times daily.     Follow up: prn.  However, if worse signs of infections as discussed (fever, chills, nausea, vomiting, worsening redness, worsening pain), then call or return immediately.

## 2023-08-24 ENCOUNTER — Other Ambulatory Visit: Payer: PRIVATE HEALTH INSURANCE

## 2023-08-31 ENCOUNTER — Other Ambulatory Visit: Payer: PRIVATE HEALTH INSURANCE

## 2023-09-21 ENCOUNTER — Other Ambulatory Visit: Payer: PRIVATE HEALTH INSURANCE

## 2023-09-21 DIAGNOSIS — E291 Testicular hypofunction: Secondary | ICD-10-CM

## 2023-09-21 DIAGNOSIS — R7989 Other specified abnormal findings of blood chemistry: Secondary | ICD-10-CM

## 2023-09-21 MED ORDER — TESTOSTERONE CYPIONATE 200 MG/ML IM SOLN
200.0000 mg | Freq: Once | INTRAMUSCULAR | Status: AC
  Administered 2023-09-21: 200 mg via INTRAMUSCULAR

## 2023-09-24 NOTE — Progress Notes (Signed)
 Cardiology Office Note:    Date:  09/28/2023   ID:  LEOVANNI BJORKMAN, DOB 11/09/1965, MRN [id number]  PCP:  Genia Del   [facility] Providers Cardiologist:  None     Referring MD: Jac Canavan, PA-C   Chief Complaint  Patient presents with   Hypertension   Hyperlipidemia    History of Present Illness:    Shawn Pena is a 58 y.o. male is seen at the request of Crosby Oyster PA-C for cardiac evaluation. He has a history of HTN, HLD and family history of CAD. He states he feels very well. No chest pain, dyspnea, palpitations or dizziness. 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 May get minimal swelling at times.   Past Medical History:  Diagnosis Date   Erectile dysfunction    Genital herpes    Glaucoma    Hyperlipidemia    Hypertension    Hypogonadism male    Low testosterone     Past Surgical History:  Procedure Laterality Date   COLONOSCOPY  10/2017   Tubular Adenoma, repeat 5 years, Dr. Erick Blinks   THROAT SURGERY     vocal cord polyp   WISDOM TOOTH EXTRACTION      Current Medications: Current Meds  Medication Sig   acetaminophen (TYLENOL) 325 MG tablet Take 650 mg by mouth every 6 (six) hours as needed.   albuterol (VENTOLIN HFA) 108 (90 Base) MCG/ACT inhaler TAKE 2 PUFFS BY MOUTH EVERY 6 HOURS AS NEEDED FOR WHEEZE OR SHORTNESS OF BREATH   amLODipine (NORVASC) 10 MG tablet Take 1 tablet (10 mg total) by mouth daily.   brimonidine-timolol (COMBIGAN) 0.2-0.5 % ophthalmic solution Place 2 drops into both eyes every 12 (twelve) hours.   Cholecalciferol (VITAMIN D3) 50 MCG (2000 UT) capsule Take 1 capsule (2,000 Units total) by mouth daily.   clindamycin-benzoyl peroxide (BENZACLIN) gel APPLY TO AFFECTED AREA TWICE A DAY   doxycycline (VIBRA-TABS) 100 MG tablet Take 1 tablet (100 mg total) by mouth 2 (two) times daily.   HYDROcodone-acetaminophen (NORCO) 5-325 MG tablet Take 1 tablet by mouth every 6 (six) hours as  needed.   irbesartan (AVAPRO) 150 MG tablet Take 1 tablet (150 mg total) by mouth daily.   pravastatin (PRAVACHOL) 20 MG tablet Take 1 tablet (20 mg total) by mouth daily.   sildenafil (REVATIO) 20 MG tablet 3 tablets daily prn   testosterone cypionate (DEPOTESTOSTERONE CYPIONATE) 200 MG/ML injection INJECT 1 ML (200 MG TOTAL) INTO THE MUSCLE EVERY 21 (TWENTY-EIGHT) DAYS.   valACYclovir (VALTREX) 1000 MG tablet TAKE 2 TABLETS BY MOUTH TWICE A DAY FOR 2 DAYS FOR FLARE UP   VYZULTA 0.024 % SOLN Apply 1 drop to eye at bedtime.     Allergies:   Penicillins   Social History   Socioeconomic History   Marital status: Married    Spouse name: Not on file   Number of children: Not on file   Years of education: Not on file   Highest education level: Not on file  Occupational History   Not on file  Tobacco Use   Smoking status: Former    Current packs/day: 0.00    Types: Cigarettes    Quit date: 07/14/1997    Years since quitting: 26.2   Smokeless tobacco: Never  Vaping Use   Vaping status: Never Used  Substance and Sexual Activity   Alcohol use: Yes    Alcohol/week: 8.0 standard drinks  of alcohol    Types: 8 Shots of liquor per week    Comment: "very littl"   Drug use: No   Sexual activity: Yes    Partners: Female    Comment: married  Other Topics Concern   Not on file  Social History Narrative   Lives with wife, great nephew sometimes stays with them.  Exercise - walks some.   Benna Dunks.  03/2021   Social Drivers of Corporate investment banker Strain: Not on file  Food Insecurity: Not on file  Transportation Needs: Not on file  Physical Activity: Not on file  Stress: Not on file  Social Connections: Not on file     Family History: The patient's family history includes Cancer in his maternal grandfather, maternal grandmother, and paternal grandmother; Diabetes in his mother and sister; Esophageal cancer in his maternal uncle; Heart disease in his mother; Hypertension in his  brother, father, mother, and sister; Kidney disease in his mother; Ovarian cancer in his paternal grandmother; Prostate cancer in his maternal grandfather; Stomach cancer in his maternal grandmother; Stroke in his paternal grandfather. There is no history of Colon cancer, Colon polyps, or Rectal cancer.  ROS:   Please see the history of present illness.     All other systems reviewed and are negative.  EKGs/Labs/Other Studies Reviewed:    The following studies were reviewed today: EKG Interpretation Date/Time:  Monday September 28 2023 08:38:00 EDT Ventricular Rate:  74 PR Interval:  134 QRS Duration:  84 QT Interval:  368 QTC Calculation: 408 R Axis:   -32  Text Interpretation: Normal sinus rhythm Left axis deviation When compared with ECG of Sept 21, 2021 QRS axis Shifted left Confirmed by [place], Lakisha Peyser [phone]) on 09/28/2023 8:45:58 AM   EKG Interpretation Date/Time:  Monday September 28 2023 08:38:00 EDT Ventricular Rate:  74 PR Interval:  134 QRS Duration:  84 QT Interval:  368 QTC Calculation: 408 R Axis:   -32  Text Interpretation: Normal sinus rhythm Left axis deviation When compared with ECG of Sept 21, 2021 QRS axis Shifted left Confirmed by [place], Trishna Cwik [phone]) on 09/28/2023 8:45:58 AM    Recent Labs: 05/18/2023: ALT 14; Hemoglobin 13.7; Platelets 196  Recent Lipid Panel    Component Value Date/Time   CHOL 139 05/18/2023 0813   TRIG 68 05/18/2023 0813   HDL 43 05/18/2023 0813   CHOLHDL 3.2 05/18/2023 0813   LDLCALC 82 05/18/2023 0813     Risk Assessment/Calculations:                Physical Exam:    VS:  BP 120/62   Pulse 74   Ht 6\' 3"  (1.905 m)   Wt 187 lb 3.2 oz (84.9 kg)   SpO2 95%   BMI 23.40 kg/m     Wt Readings from Last 3 Encounters:  09/28/23 187 lb 3.2 oz (84.9 kg)  08/19/23 186 lb 6.4 oz (84.6 kg)  07/13/23 184 lb 9.6 oz (83.7 kg)     GEN:  Well nourished, well developed in no acute distress HEENT: Normal NECK: No JVD; No carotid  bruits LYMPHATICS: No lymphadenopathy CARDIAC: RRR, no murmurs, rubs, gallops RESPIRATORY:  Clear to auscultation without rales, wheezing or rhonchi  ABDOMEN: Soft, non-tender, non-distended MUSCULOSKELETAL:  No edema; No deformity  SKIN: Warm and dry NEUROLOGIC:  Alert and oriented x 3 PSYCHIATRIC:  Normal affect   ASSESSMENT:    1. Essential hypertension, benign   2. Hyperlipidemia, unspecified hyperlipidemia type  PLAN:    In order of problems listed above:  HTN well controlled on ARB and amlodipine. Encourage low sodium heart healthy diet. Encourage more aerobic activity HLD on pravasatin.  CV - asymptomatic. Discussed possibility of doing a coronary calcium score but I am not sure it would change his management at this time. Discussed if he does develop any cardiac complaints he should return for reevaluation           Medication Adjustments/Labs and Tests Ordered: Current medicines are reviewed at length with the patient today.  Concerns regarding medicines are outlined above.  Orders Placed This Encounter  Procedures   EKG 12-Lead   No orders of the defined types were placed in this encounter.   Patient Instructions  Medication Instructions:  Continue same medications *If you need a refill on your cardiac medications before your next appointment, please call your pharmacy*   Lab Work: None ordered   Testing/Procedures: None ordered   Follow-Up: At [facility], you and your health needs are our priority.  As part of our continuing mission to provide you with exceptional heart care, we have created designated Provider Care Teams.  These Care Teams include your primary Cardiologist (physician) and Advanced Practice Providers (APPs -  Physician Assistants and Nurse Practitioners) who all work together to provide you with the care you need, when you need it.  We recommend signing up for the patient portal called "MyChart".  Sign up information is  provided on this After Visit Summary.  MyChart is used to connect with patients for Virtual Visits (Telemedicine).  Patients are able to view lab/test results, encounter notes, upcoming appointments, etc.  Non-urgent messages can be sent to your provider as well.   To learn more about what you can do with MyChart, go to [email or web address].    Your next appointment:  As Needed    Provider: Dr.Mao Lockner         Signed, Lisamarie Coke [place], MD  09/28/2023 8:56 AM    [facility]

## 2023-09-28 ENCOUNTER — Ambulatory Visit: Payer: PRIVATE HEALTH INSURANCE | Attending: Cardiology | Admitting: Cardiology

## 2023-09-28 ENCOUNTER — Encounter: Payer: Self-pay | Admitting: Cardiology

## 2023-09-28 VITALS — BP 120/62 | HR 74 | Ht 75.0 in | Wt 187.2 lb

## 2023-09-28 DIAGNOSIS — E785 Hyperlipidemia, unspecified: Secondary | ICD-10-CM

## 2023-09-28 DIAGNOSIS — I10 Essential (primary) hypertension: Secondary | ICD-10-CM | POA: Diagnosis not present

## 2023-09-28 NOTE — Patient Instructions (Signed)
 Medication Instructions:  Continue same medications *If you need a refill on your cardiac medications before your next appointment, please call your pharmacy*   Lab Work: None ordered   Testing/Procedures: None ordered   Follow-Up: At [facility], you and your health needs are our priority.  As part of our continuing mission to provide you with exceptional heart care, we have created designated Provider Care Teams.  These Care Teams include your primary Cardiologist (physician) and Advanced Practice Providers (APPs -  Physician Assistants and Nurse Practitioners) who all work together to provide you with the care you need, when you need it.  We recommend signing up for the patient portal called "MyChart".  Sign up information is provided on this After Visit Summary.  MyChart is used to connect with patients for Virtual Visits (Telemedicine).  Patients are able to view lab/test results, encounter notes, upcoming appointments, etc.  Non-urgent messages can be sent to your provider as well.   To learn more about what you can do with MyChart, go to [email or web address].    Your next appointment:  As Needed    Provider:  Dr.Jordan

## 2023-10-12 ENCOUNTER — Other Ambulatory Visit (INDEPENDENT_AMBULATORY_CARE_PROVIDER_SITE_OTHER): Payer: PRIVATE HEALTH INSURANCE

## 2023-10-12 DIAGNOSIS — E291 Testicular hypofunction: Secondary | ICD-10-CM | POA: Diagnosis not present

## 2023-10-12 DIAGNOSIS — R7989 Other specified abnormal findings of blood chemistry: Secondary | ICD-10-CM | POA: Diagnosis not present

## 2023-10-12 MED ORDER — TESTOSTERONE CYPIONATE 200 MG/ML IM SOLN
200.0000 mg | INTRAMUSCULAR | Status: DC
  Administered 2023-10-12 – 2024-01-25 (×6): 200 mg via INTRAMUSCULAR

## 2023-11-02 ENCOUNTER — Ambulatory Visit: Payer: PRIVATE HEALTH INSURANCE | Admitting: Nurse Practitioner

## 2023-11-02 ENCOUNTER — Encounter: Payer: Self-pay | Admitting: Nurse Practitioner

## 2023-11-02 ENCOUNTER — Other Ambulatory Visit: Payer: PRIVATE HEALTH INSURANCE

## 2023-11-02 VITALS — BP 130/82 | HR 84 | Wt 182.8 lb

## 2023-11-02 DIAGNOSIS — E291 Testicular hypofunction: Secondary | ICD-10-CM

## 2023-11-02 DIAGNOSIS — R7989 Other specified abnormal findings of blood chemistry: Secondary | ICD-10-CM

## 2023-11-02 DIAGNOSIS — B9689 Other specified bacterial agents as the cause of diseases classified elsewhere: Secondary | ICD-10-CM | POA: Diagnosis not present

## 2023-11-02 DIAGNOSIS — J069 Acute upper respiratory infection, unspecified: Secondary | ICD-10-CM | POA: Diagnosis not present

## 2023-11-02 MED ORDER — GUAIFENESIN ER 600 MG PO TB12: 1200.0000 mg | ORAL_TABLET | Freq: Two times a day (BID) | ORAL | 1 refills | Status: DC | PRN

## 2023-11-02 MED ORDER — PREDNISONE 20 MG PO TABS: 40.0000 mg | ORAL_TABLET | Freq: Every day | ORAL | 0 refills | Status: DC

## 2023-11-02 MED ORDER — BENZONATATE 200 MG PO CAPS
200.0000 mg | ORAL_CAPSULE | Freq: Two times a day (BID) | ORAL | 0 refills | Status: DC | PRN
Start: 2023-11-02 — End: 2024-01-12

## 2023-11-02 MED ORDER — AZITHROMYCIN 250 MG PO TABS: ORAL_TABLET | ORAL | 0 refills | Status: AC

## 2023-11-02 NOTE — Patient Instructions (Addendum)
 If you are not feeling like this is improving by the end of the week or Shawn Pena next week, please send a MyChart message and let me know and we will get a chest x-ray. I want to make sure we can get this cleared up for you.   I have sent in Azithromycin , which is an antibiotic, for the infection.  I also sent in prednisone  to help reduce inflammation in the lungs and prevent wheezing.  I have also sent in Tessalon  Perles and Mucinex . The tessalon  helps to keep you from coughing so much and the mucinex  will help bring up the phlegm and get it out of your lungs.   I have given you a sample of an inhaler to help to reduce inflammation in the lungs and make breathing easier. You just need this for a short period while you are sick.

## 2023-11-02 NOTE — Assessment & Plan Note (Signed)
 Worsening cough over the past week, congestion, and production of brown sputum. Auscultation reveals a rattling sound in the lungs, indicating fluid presence. Likely started as an allergic reaction and progressed to a mild infection. Denies shortness of breath or lightheadedness but reports needing to sleep sitting up due to congestion. No recent history of upper respiratory infection. Allergic to penicillin. Informed consent for azithromycin , Tessalon  Perles, and Mucinex  was obtained. Discussed potential side effects of prednisone , including increased hunger and sleep disturbances if taken late in the day. Prednisone  can reduce lung inflammation and help expectorate phlegm. A steroid inhaler sample was provided to reduce lung inflammation over two weeks. - Prescribe azithromycin  for one week along with prednisone  burst for 5 days - Prescribe Tessalon  Perles for cough management - Recommend Mucinex  to help expectorate phlegm - Provide a sample of a steroid inhaler for two weeks to reduce lung inflammation - Advise continued use of albuterol  inhaler as needed for shortness of breath or wheezing - Instruct to follow up if symptoms do not improve by next Monday or worsen by the end of the week - Consider chest x-ray if symptoms persist or worsen

## 2023-11-02 NOTE — Progress Notes (Signed)
 Annella Kief, DNP, AGNP-c [facility] [address] [place], [place] [address] [phone]   ACUTE VISIT- ESTABLISHED PATIENT  Blood pressure 130/82, pulse 84, weight 182 lb 12.8 oz (82.9 kg).  Subjective:  HPI Shawn Pena is a 58 y.o. male presents to day for evaluation of acute concern(s).   History of Present Illness Shawn Pena is a 58 year old male who presents with a persistent cough and congestion.  He has been experiencing a cough that began last Monday after rolling down his car window and feeling something enter the vehicle. The cough progressively worsened by Wednesday, prompting him to stay home from work. He returned to work on Friday and Saturday, but developed congestion on Saturday. The cough produces brownish sputum and is more pronounced when lying down, causing a rattling sensation in his chest.  No shortness of breath, lightheadedness, or chills. He mentions having a fever last week, sinus pressure last week, and epistaxis on Saturday. He experiences daily swelling in his feet and ankles, which resolves by morning, attributing it to standing for twelve hours a day.  He is allergic to penicillin and uses an albuterol  inhaler as needed. He has not had any recent upper respiratory infections that he is aware of.  ROS negative except for what is listed in HPI. History, Medications, Surgery, SDOH, and Family History reviewed and updated as appropriate.  Objective:  Physical Exam Vitals and nursing note reviewed.  Constitutional:      General: He is not in acute distress.    Appearance: Normal appearance. He is not toxic-appearing.  HENT:     Head: Normocephalic.  Eyes:     Conjunctiva/sclera: Conjunctivae normal.  Cardiovascular:     Rate and Rhythm: Normal rate and regular rhythm.     Pulses: Normal pulses.     Heart sounds: Normal heart sounds.  Pulmonary:     Effort: Pulmonary effort is normal.     Breath sounds: Examination of the  right-middle field reveals rhonchi. Examination of the left-middle field reveals rhonchi. Examination of the right-lower field reveals rhonchi. Examination of the left-lower field reveals rhonchi. Wheezing and rhonchi present.  Musculoskeletal:     Right lower leg: No edema.     Left lower leg: No edema.  Lymphadenopathy:     Cervical: No cervical adenopathy.  Skin:    General: Skin is warm and dry.     Capillary Refill: Capillary refill takes less than 2 seconds.  Neurological:     General: No focal deficit present.     Mental Status: He is alert and oriented to person, place, and time.  Psychiatric:        Mood and Affect: Mood normal.        Behavior: Behavior normal.         Assessment & Plan:   Problem List Items Addressed This Visit     Bacterial URI - Primary   Worsening cough over the past week, congestion, and production of brown sputum. Auscultation reveals a rattling sound in the lungs, indicating fluid presence. Likely started as an allergic reaction and progressed to a mild infection. Denies shortness of breath or lightheadedness but reports needing to sleep sitting up due to congestion. No recent history of upper respiratory infection. Allergic to penicillin. Informed consent for azithromycin , Tessalon  Perles, and Mucinex  was obtained. Discussed potential side effects of prednisone , including increased hunger and sleep disturbances if taken late in the day. Prednisone  can reduce lung inflammation and help  expectorate phlegm. A steroid inhaler sample was provided to reduce lung inflammation over two weeks. - Prescribe azithromycin  for one week along with prednisone  burst for 5 days - Prescribe Tessalon  Perles for cough management - Recommend Mucinex  to help expectorate phlegm - Provide a sample of a steroid inhaler for two weeks to reduce lung inflammation - Advise continued use of albuterol  inhaler as needed for shortness of breath or wheezing - Instruct to follow up if  symptoms do not improve by next Monday or worsen by the end of the week - Consider chest x-ray if symptoms persist or worsen      Relevant Medications   azithromycin  (ZITHROMAX ) 250 MG tablet   benzonatate  (TESSALON ) 200 MG capsule   guaiFENesin  (MUCINEX ) 600 MG 12 hr tablet   predniSONE  (DELTASONE ) 20 MG tablet      Annella Kief, DNP, AGNP-c

## 2023-11-20 ENCOUNTER — Other Ambulatory Visit: Payer: Self-pay | Admitting: Medical

## 2023-11-23 ENCOUNTER — Other Ambulatory Visit (INDEPENDENT_AMBULATORY_CARE_PROVIDER_SITE_OTHER): Payer: PRIVATE HEALTH INSURANCE

## 2023-11-23 DIAGNOSIS — E291 Testicular hypofunction: Secondary | ICD-10-CM | POA: Diagnosis not present

## 2023-11-23 DIAGNOSIS — R7989 Other specified abnormal findings of blood chemistry: Secondary | ICD-10-CM

## 2023-12-01 LAB — LAB REPORT - SCANNED
Albumin, Urine POC: <12
Albumin/Creatinine Ratio, Urine, POC: <11
Creatinine, POC: 106 mg/dL
EGFR: 36

## 2023-12-14 ENCOUNTER — Other Ambulatory Visit (INDEPENDENT_AMBULATORY_CARE_PROVIDER_SITE_OTHER): Payer: PRIVATE HEALTH INSURANCE

## 2023-12-14 DIAGNOSIS — E291 Testicular hypofunction: Secondary | ICD-10-CM

## 2023-12-14 DIAGNOSIS — R7989 Other specified abnormal findings of blood chemistry: Secondary | ICD-10-CM

## 2024-01-04 ENCOUNTER — Other Ambulatory Visit: Payer: PRIVATE HEALTH INSURANCE

## 2024-01-04 DIAGNOSIS — R7989 Other specified abnormal findings of blood chemistry: Secondary | ICD-10-CM | POA: Diagnosis not present

## 2024-01-04 DIAGNOSIS — E291 Testicular hypofunction: Secondary | ICD-10-CM

## 2024-01-11 ENCOUNTER — Ambulatory Visit: Payer: PRIVATE HEALTH INSURANCE | Admitting: Medical

## 2024-01-11 ENCOUNTER — Encounter: Payer: Self-pay | Admitting: Medical

## 2024-01-11 VITALS — BP 112/78 | HR 78 | Ht 74.0 in | Wt 180.6 lb

## 2024-01-11 DIAGNOSIS — L709 Acne, unspecified: Secondary | ICD-10-CM

## 2024-01-11 DIAGNOSIS — E291 Testicular hypofunction: Secondary | ICD-10-CM

## 2024-01-11 DIAGNOSIS — R7301 Impaired fasting glucose: Secondary | ICD-10-CM

## 2024-01-11 DIAGNOSIS — I10 Essential (primary) hypertension: Secondary | ICD-10-CM | POA: Diagnosis not present

## 2024-01-11 DIAGNOSIS — N183 Chronic kidney disease, stage 3 unspecified: Secondary | ICD-10-CM | POA: Diagnosis not present

## 2024-01-11 DIAGNOSIS — E785 Hyperlipidemia, unspecified: Secondary | ICD-10-CM

## 2024-01-11 MED ORDER — CLINDAMYCIN PHOS-BENZOYL PEROX 1-5 % EX GEL: Freq: Two times a day (BID) | CUTANEOUS | 2 refills | Status: AC

## 2024-01-11 NOTE — Progress Notes (Signed)
 Subjective:  Shawn Pena is a 58 y.o. male who presents for Chief Complaint  Patient presents with   Medical Management of Chronic Issues    Med check- 6 months follow-up, no update to medical or surgical history.    Here for med check.  Hypogonadism, low testosterone -he currently is on testosterone  cypionate 200 mg every 21 days IM injection here.  Years ago he was on AndroGel  for length of time as well as Testim  but both got too expensive.  So he has been on the testosterone  injections for a long time.  He does not like they are as effective as they could be.  He wants to try something else.  Erectile dysfunction-sildenafil  generic does not work that well.  His insurance does not cover namebrand Cialis or Viagra  all that well.  He is considering other options including over-the-counter oral disintegrating non-FDA approved medication.  He saw his kidney doctor back in May, no changes were made to his medications  Hypertension-compliant with amlodipine  10 mg daily and irbesartan  150 mg daily  Hyperlipidemia-compliant with pravastatin  20 mg daily  He works about 60 hours a week.  He is happy about his blood pressure today.  No other aggravating or relieving factors.    No other c/o.  Past Medical History:  Diagnosis Date   Erectile dysfunction    Genital herpes    Glaucoma    Hyperlipidemia    Hypertension    Hypogonadism male    Low testosterone     Current Outpatient Medications on File Prior to Visit  Medication Sig Dispense Refill   acetaminophen  (TYLENOL ) 325 MG tablet Take 650 mg by mouth every 6 (six) hours as needed.     albuterol  (VENTOLIN  HFA) 108 (90 Base) MCG/ACT inhaler TAKE 2 PUFFS BY MOUTH EVERY 6 HOURS AS NEEDED FOR WHEEZE OR SHORTNESS OF BREATH 8.5 each 1   amLODipine  (NORVASC ) 10 MG tablet Take 1 tablet (10 mg total) by mouth daily. 90 tablet 3   benzonatate  (TESSALON ) 200 MG capsule Take 1 capsule (200 mg total) by mouth 2 (two) times daily as needed for  cough. 20 capsule 0   brimonidine-timolol (COMBIGAN) 0.2-0.5 % ophthalmic solution Place 2 drops into both eyes every 12 (twelve) hours.     guaiFENesin  (MUCINEX ) 600 MG 12 hr tablet Take 2 tablets (1,200 mg total) by mouth 2 (two) times daily as needed for to loosen phlegm or cough. 30 tablet 1   HYDROcodone -acetaminophen  (NORCO) 5-325 MG tablet Take 1 tablet by mouth every 6 (six) hours as needed. 10 tablet 0   irbesartan  (AVAPRO ) 150 MG tablet Take 1 tablet (150 mg total) by mouth daily. 90 tablet 3   pravastatin  (PRAVACHOL ) 20 MG tablet Take 1 tablet (20 mg total) by mouth daily. 90 tablet 3   sildenafil  (REVATIO ) 20 MG tablet 3 tablets daily prn 100 tablet 5   testosterone  cypionate (DEPOTESTOSTERONE CYPIONATE) 200 MG/ML injection INJECT 1 ML (200 MG TOTAL) INTO THE MUSCLE EVERY 21 (TWENTY-EIGHT) DAYS. 1 mL 2   valACYclovir  (VALTREX ) 1000 MG tablet TAKE 2 TABLETS BY MOUTH TWICE A DAY FOR 2 DAYS FOR FLARE UP 20 tablet 3   VYZULTA 0.024 % SOLN Apply 1 drop to eye at bedtime.     predniSONE  (DELTASONE ) 20 MG tablet Take 2 tablets (40 mg total) by mouth daily with breakfast. 10 tablet 0   Current Facility-Administered Medications on File Prior to Visit  Medication Dose Route Frequency Provider Last Rate Last Admin   testosterone   cypionate (DEPOTESTOSTERONE CYPIONATE) injection 200 mg  200 mg Intramuscular Q21 days Bulah Alm RAMAN, PA-C   200 mg at 01/04/24 9183    The following portions of the patient's history were reviewed and updated as appropriate: allergies, current medications, past family history, past medical history, past social history, past surgical history and problem list.  ROS Otherwise as in subjective above    Objective: BP 112/78   Pulse 78   Ht 6' 2 (1.88 m)   Wt 180 lb 9.6 oz (81.9 kg)   SpO2 98%   BMI 23.19 kg/m   BP Readings from Last 3 Encounters:  01/11/24 112/78  11/02/23 130/82  09/28/23 120/62   Wt Readings from Last 3 Encounters:  01/11/24 180 lb  9.6 oz (81.9 kg)  11/02/23 182 lb 12.8 oz (82.9 kg)  09/28/23 187 lb 3.2 oz (84.9 kg)    General appearance: alert, no distress, well developed, well nourished Heart: RRR, normal S1, S2, no murmurs Lungs: CTA bilaterally, no wheezes, rhonchi, or rales Pulses: 2+ radial pulses, 2+ pedal pulses, normal cap refill Ext: no edema     Assessment: Encounter Diagnoses  Name Primary?   Hypogonadism in male Yes   Stage 3 chronic kidney disease, unspecified whether stage 3a or 3b CKD (HCC)    Impaired fasting blood sugar    Essential hypertension, benign    Hyperlipidemia, unspecified hyperlipidemia type    Acne, unspecified acne type      Plan: Hypogonadism, low testosterone -he feels like the current therapy is not as effective as it can be.  He is currently on testosterone  200 mg every 21 days injectable IM.  In the past he has been on Testim  and AndroGel .  Cost has been an issue in the past.  Updated labs today.  We will likely change to Jatenzo oral or topical testosterone  gel  Stage III CKD-I reviewed need for allergy notes from 11/2023.  No changes in medication therapy at that time.  Impaired glucose-updated labs today  Hypertension at goal-continue amlodipine  10 mg daily, irbesartan  150 mg daily  Hyperlipidemia-continue Pravachol  20 mg daily  Erectile dysfunction-he is going to try some over-the-counter non-FDA approved oral medication as he feels like the sildenafil  is not that effective and other namebrand products are too expensive.  We discussed potential options for therapy going forward.  He will let me know how he wants to proceed  Acne - refilled benzaclin which he uses for acne related to side effect of TST therapy  [name] was seen today for medical management of chronic issues.  Diagnoses and all orders for this visit:  Hypogonadism in male -     Testosterone   Stage 3 chronic kidney disease, unspecified whether stage 3a or 3b CKD (HCC)  Impaired fasting blood  sugar -     Hemoglobin A1c  Essential hypertension, benign  Hyperlipidemia, unspecified hyperlipidemia type  Acne, unspecified acne type  Other orders -     clindamycin -benzoyl peroxide (BENZACLIN) gel; Apply topically 2 (two) times daily.    Follow up: pending labs

## 2024-01-12 ENCOUNTER — Other Ambulatory Visit: Payer: Self-pay | Admitting: Medical

## 2024-01-12 ENCOUNTER — Ambulatory Visit: Payer: Self-pay | Admitting: Medical

## 2024-01-12 LAB — HEMOGLOBIN A1C
Est. average glucose Bld gHb Est-mCnc: 137 mg/dL
Hgb A1c MFr Bld: 6.4 % — ABNORMAL HIGH (ref 4.8–5.6)

## 2024-01-12 LAB — TESTOSTERONE: Testosterone: 665 ng/dL (ref 264–916)

## 2024-01-12 MED ORDER — JATENZO 237 MG PO CAPS: 1.0000 | ORAL_CAPSULE | Freq: Two times a day (BID) | ORAL | 0 refills | Status: DC

## 2024-01-12 MED ORDER — SILDENAFIL CITRATE 100 MG PO TABS
100.0000 mg | ORAL_TABLET | ORAL | 2 refills | Status: DC | PRN
Start: 2024-01-12 — End: 2024-04-11

## 2024-01-12 NOTE — Progress Notes (Signed)
 Results sent through MyChart

## 2024-01-22 ENCOUNTER — Other Ambulatory Visit: Payer: Self-pay | Admitting: Medical

## 2024-01-22 NOTE — Telephone Encounter (Signed)
 Last apt 01/11/24.

## 2024-01-22 NOTE — Telephone Encounter (Signed)
 Copied from CRM [id number]. Topic: Clinical - Medication Refill >> Jan 22, 2024  3:43 PM Silvana PARAS wrote: Medication: Testosterone  Undecanoate (JATENZO ) 237 MG CAPS  Has the patient contacted their pharmacy? Yes (Agent: If no, request that the patient contact the pharmacy for the refill. If patient does not wish to contact the pharmacy document the reason why and proceed with request.) (Agent: If yes, when and what did the pharmacy advise?)  This is the patient's preferred pharmacy:  [id number] - [place], [place] - [address] [place] [address] Phone: [phone] Fax: [phone]    Is this the correct pharmacy for this prescription? Yes If no, delete pharmacy and type the correct one.   Has the prescription been filled recently? Yes  Is the patient out of the medication? No  Has the patient been seen for an appointment in the last year OR does the patient have an upcoming appointment? Yes  Can we respond through MyChart? Yes  Agent: Please be advised that Rx refills may take up to 3 business days. We ask that you follow-up with your pharmacy.

## 2024-01-25 ENCOUNTER — Telehealth: Payer: Self-pay | Admitting: Medical

## 2024-01-25 ENCOUNTER — Other Ambulatory Visit: Payer: Self-pay | Admitting: Medical

## 2024-01-25 ENCOUNTER — Other Ambulatory Visit (INDEPENDENT_AMBULATORY_CARE_PROVIDER_SITE_OTHER): Payer: PRIVATE HEALTH INSURANCE

## 2024-01-25 ENCOUNTER — Telehealth: Payer: Self-pay

## 2024-01-25 ENCOUNTER — Other Ambulatory Visit (HOSPITAL_COMMUNITY): Payer: Self-pay

## 2024-01-25 DIAGNOSIS — R7989 Other specified abnormal findings of blood chemistry: Secondary | ICD-10-CM

## 2024-01-25 DIAGNOSIS — E291 Testicular hypofunction: Secondary | ICD-10-CM

## 2024-01-25 MED ORDER — JATENZO 237 MG PO CAPS: 1.0000 | ORAL_CAPSULE | Freq: Two times a day (BID) | ORAL | 2 refills | Status: DC

## 2024-01-25 MED ORDER — TESTOSTERONE CYPIONATE 200 MG/ML IM SOLN: 200.0000 mg | INTRAMUSCULAR | 1 refills | Status: DC

## 2024-01-25 MED ORDER — TESTOSTERONE CYPIONATE 200 MG/ML IM SOLN: 200.0000 mg | INTRAMUSCULAR | Status: DC

## 2024-01-25 NOTE — Telephone Encounter (Signed)
 Called and left detailed message on patient vm as well as sent a mychart message too

## 2024-01-25 NOTE — Telephone Encounter (Signed)
 Pt is needing testosterone  injection called into [id number] - [place], [place] - [address].  Pt has not picked up the capsule version, wants the injection.

## 2024-01-25 NOTE — Telephone Encounter (Signed)
 Patient never had a PA for oral Jatenzo  to see if insurance would be covered. Pt prefers Oral medication vs. Injection. And never knew it was sent to pharmacy as he never heard anything on this medication.    Can we try to get a PA done for Jatenzo 

## 2024-01-25 NOTE — Telephone Encounter (Signed)
 Pharmacy Patient Advocate Encounter  Received notification from [employer]  that Prior Authorization for Jatenzo  237MG  capsules has been APPROVED from 7.14.25 to 1.10.26. Ran test claim, Copay is $10.00. This test claim was processed through [facility]- copay amounts may vary at other pharmacies due to pharmacy/plan contracts, or as the patient moves through the different stages of their insurance plan.   PA #/Case ID/Reference #: (Key: [id number])

## 2024-01-25 NOTE — Telephone Encounter (Signed)
 Pharmacy Patient Advocate Encounter   Received notification from Physician's Office that prior authorization for Jatenzo  237MG  capsules is required/requested.   Insurance verification completed.   The patient is insured through [employer] .   Per test claim: PA required; PA submitted to above mentioned insurance via CoverMyMeds Key/confirmation #/EOC (Key: [id number])   Status is pending

## 2024-02-15 ENCOUNTER — Other Ambulatory Visit: Payer: PRIVATE HEALTH INSURANCE

## 2024-02-16 ENCOUNTER — Telehealth: Payer: Self-pay

## 2024-02-16 ENCOUNTER — Other Ambulatory Visit (HOSPITAL_COMMUNITY): Payer: Self-pay

## 2024-02-16 NOTE — Telephone Encounter (Signed)
 Pharmacy Patient Advocate Encounter   Received notification from CoverMyMeds that prior authorization for Testosterone  Cypionate 200MG /ML intramuscular solution is required/requested.   Insurance verification completed.   The patient is insured through [employer] .   Per test claim: PA required; PA submitted to above mentioned insurance via CoverMyMeds Key/confirmation #/EOC (Key: [facility]) Status is pending

## 2024-02-16 NOTE — Telephone Encounter (Signed)
 Pharmacy Patient Advocate Encounter  Received notification from [employer] that Prior Authorization for Testosterone  CYP 200mg /ML has been APPROVED from 02/16/24 to 02/15/25 RX already filled  PA #/Case ID/Reference #: [id number]

## 2024-02-18 ENCOUNTER — Other Ambulatory Visit (HOSPITAL_COMMUNITY): Payer: Self-pay

## 2024-04-11 ENCOUNTER — Other Ambulatory Visit: Payer: Self-pay | Admitting: Medical

## 2024-05-14 ENCOUNTER — Other Ambulatory Visit: Payer: Self-pay | Admitting: Medical

## 2024-05-21 ENCOUNTER — Other Ambulatory Visit: Payer: Self-pay | Admitting: Medical

## 2024-05-23 NOTE — Telephone Encounter (Signed)
 Patient is due in December for 6 month med check. Please schedule

## 2024-06-06 ENCOUNTER — Ambulatory Visit (INDEPENDENT_AMBULATORY_CARE_PROVIDER_SITE_OTHER): Payer: PRIVATE HEALTH INSURANCE | Admitting: Medical

## 2024-06-06 ENCOUNTER — Encounter: Payer: Self-pay | Admitting: Medical

## 2024-06-06 VITALS — BP 122/78 | HR 78 | Ht 74.0 in | Wt 184.0 lb

## 2024-06-06 DIAGNOSIS — Z0000 Encounter for general adult medical examination without abnormal findings: Secondary | ICD-10-CM

## 2024-06-06 DIAGNOSIS — R7989 Other specified abnormal findings of blood chemistry: Secondary | ICD-10-CM

## 2024-06-06 DIAGNOSIS — R7301 Impaired fasting glucose: Secondary | ICD-10-CM

## 2024-06-06 DIAGNOSIS — N400 Enlarged prostate without lower urinary tract symptoms: Secondary | ICD-10-CM

## 2024-06-06 DIAGNOSIS — Z125 Encounter for screening for malignant neoplasm of prostate: Secondary | ICD-10-CM | POA: Diagnosis not present

## 2024-06-06 DIAGNOSIS — D631 Anemia in chronic kidney disease: Secondary | ICD-10-CM

## 2024-06-06 DIAGNOSIS — N183 Chronic kidney disease, stage 3 unspecified: Secondary | ICD-10-CM

## 2024-06-06 DIAGNOSIS — I10 Essential (primary) hypertension: Secondary | ICD-10-CM

## 2024-06-06 DIAGNOSIS — Z2820 Immunization not carried out because of patient decision for unspecified reason: Secondary | ICD-10-CM

## 2024-06-06 DIAGNOSIS — N189 Chronic kidney disease, unspecified: Secondary | ICD-10-CM

## 2024-06-06 DIAGNOSIS — E291 Testicular hypofunction: Secondary | ICD-10-CM

## 2024-06-06 DIAGNOSIS — E785 Hyperlipidemia, unspecified: Secondary | ICD-10-CM

## 2024-06-06 DIAGNOSIS — N529 Male erectile dysfunction, unspecified: Secondary | ICD-10-CM

## 2024-06-06 NOTE — Progress Notes (Signed)
 Subjective:   HPI  Shawn Pena is a 58 y.o. male who presents for Chief Complaint  Patient presents with   other    Med check    Patient Care Team: Jesper Stirewalt, Alm GORMAN RIGGERS as PCP - General (Family Medicine) Sees dentist Sees eye doctor Dr. Almarie Bonine , formerly Dr. Gladis Roys, nephrology Dr. Gordy Starch Dr Gladis Roys, Lucie Collet, PA-C, nephrology Dr. Peter Jordan, cardiology   Concerns:  Shawn Pena is a 58 year old male who presents for an annual physical exam.  He recently refilled his medications and does not report any current concerns. No new cardiac symptoms. He had an EKG in March with a cardiologist.  He has a history of borderline blood sugar levels, with the last hemoglobin A1c checked in June. He is due for a full set of labs as his last comprehensive lab work was done in November of last year.  He underwent colon cancer screening last October, which revealed some polyps, but he was given a ten-year follow-up interval.  He drinks beer once a week, limiting himself to 36 ounces, and has significantly reduced his alcohol consumption from previous daily use.  He is up to date on his tetanus and shingles vaccines, and he had a pneumonia vaccine two years ago. He declined a flu shot today. He sees a education officer, community and eye doctor annually and has no concerns regarding HIV or STD testing.  No issues with bowel movements, chest pain, or other unusual symptoms.  Hypertension - compliant with medications, no c/o.  No edema  Hyperlipidemia - compliant with medicaiton without c/o  Sees eye doctor for glaucoma  He is nonfasting today   Reviewed their medical, surgical, family, social, medication, and allergy history and updated chart as appropriate.  Past Medical History:  Diagnosis Date   Erectile dysfunction    Genital herpes    Glaucoma    Hyperlipidemia    Hypertension    Hypogonadism male    Low testosterone      Past Surgical History:  Procedure  Laterality Date   COLONOSCOPY  10/2017   Tubular Adenoma, repeat 5 years, Dr. Gordy Starch   THROAT SURGERY     vocal cord polyp   WISDOM TOOTH EXTRACTION      Family History  Problem Relation Age of Onset   Kidney disease Mother    Heart disease Mother    Hypertension Mother    Diabetes Mother    Hypertension Father    Hypertension Sister    Diabetes Sister    Hypertension Brother    Esophageal cancer Maternal Uncle    Cancer Maternal Grandmother        stomach   Stomach cancer Maternal Grandmother    Cancer Maternal Grandfather        prostate   Prostate cancer Maternal Grandfather    Cancer Paternal Grandmother        ovarian   Ovarian cancer Paternal Grandmother    Stroke Paternal Grandfather    Colon cancer Neg Hx    Colon polyps Neg Hx    Rectal cancer Neg Hx      Current Outpatient Medications:    acetaminophen  (TYLENOL ) 325 MG tablet, Take 650 mg by mouth every 6 (six) hours as needed., Disp: , Rfl:    albuterol  (VENTOLIN  HFA) 108 (90 Base) MCG/ACT inhaler, TAKE 2 PUFFS BY MOUTH EVERY 6 HOURS AS NEEDED FOR WHEEZE OR SHORTNESS OF BREATH, Disp: 8.5 each, Rfl: 1   amLODipine  (  NORVASC ) 10 MG tablet, TAKE 1 TABLET BY MOUTH EVERY DAY, Disp: 30 tablet, Rfl: 1   brimonidine-timolol (COMBIGAN) 0.2-0.5 % ophthalmic solution, Place 2 drops into both eyes every 12 (twelve) hours., Disp: , Rfl:    clindamycin -benzoyl peroxide (BENZACLIN) gel, Apply topically 2 (two) times daily., Disp: 50 g, Rfl: 2   HYDROcodone -acetaminophen  (NORCO) 5-325 MG tablet, Take 1 tablet by mouth every 6 (six) hours as needed., Disp: 10 tablet, Rfl: 0   irbesartan  (AVAPRO ) 150 MG tablet, TAKE 1 TABLET BY MOUTH EVERY DAY, Disp: 30 tablet, Rfl: 1   pravastatin  (PRAVACHOL ) 20 MG tablet, Take 1 tablet (20 mg total) by mouth daily., Disp: 90 tablet, Rfl: 3   sildenafil  (REVATIO ) 20 MG tablet, 3 tablets daily prn, Disp: 100 tablet, Rfl: 5   sildenafil  (VIAGRA ) 100 MG tablet, TAKE 1 TABLET BY MOUTH EVERY DAY  AS NEEDED FOR ERECTILE DYSFUNCTION, Disp: 30 tablet, Rfl: 2   Testosterone  Undecanoate (JATENZO ) 237 MG CAPS, Take 1 capsule (237 mg total) by mouth in the morning and at bedtime., Disp: 60 capsule, Rfl: 2   valACYclovir  (VALTREX ) 1000 MG tablet, TAKE 2 TABLETS BY MOUTH TWICE A DAY FOR 2 DAYS FOR FLARE UP, Disp: 20 tablet, Rfl: 3   VYZULTA 0.024 % SOLN, Apply 1 drop to eye at bedtime., Disp: , Rfl:    testosterone  cypionate (DEPOTESTOSTERONE CYPIONATE) 200 MG/ML injection, Inject 1 mL (200 mg total) into the muscle every 21 ( twenty-one) days. (Patient not taking: Reported on 06/06/2024), Disp: 10 mL, Rfl: 1  Current Facility-Administered Medications:    testosterone  cypionate (DEPOTESTOSTERONE CYPIONATE) injection 200 mg, 200 mg, Intramuscular, Q21 days, Alyah Boehning, Alm RAMAN, PA-C, 200 mg at 01/25/24 1538   testosterone  cypionate (DEPOTESTOSTERONE CYPIONATE) injection 200 mg, 200 mg, Intramuscular, Q21 days, Prudy Candy, Alm RAMAN, PA-C  Allergies  Allergen Reactions   Penicillins Rash   Review of Systems  Constitutional:  Negative for chills, fever, malaise/fatigue and weight loss.  HENT:  Negative for congestion, ear pain, hearing loss, sore throat and tinnitus.   Eyes:  Negative for blurred vision, pain and redness.  Respiratory:  Negative for cough, hemoptysis and shortness of breath.   Cardiovascular:  Negative for chest pain, palpitations, orthopnea, claudication and leg swelling.  Gastrointestinal:  Negative for abdominal pain, blood in stool, constipation, diarrhea, nausea and vomiting.  Genitourinary:  Negative for dysuria, flank pain, frequency, hematuria and urgency.  Musculoskeletal:  Negative for falls, joint pain and myalgias.  Skin:  Negative for itching and rash.  Neurological:  Negative for dizziness, tingling, speech change, weakness and headaches.  Endo/Heme/Allergies:  Negative for polydipsia. Does not bruise/bleed easily.  Psychiatric/Behavioral:  Negative for depression and  memory loss. The patient is not nervous/anxious and does not have insomnia.        01/11/2024    8:38 AM 05/11/2023    8:56 AM 04/07/2022    1:50 PM 11/20/2021    1:30 PM 04/01/2021    3:07 PM  Depression screen PHQ 2/9  Decreased Interest 0 0 0 0 0  Down, Depressed, Hopeless 0 0 0 0 0  PHQ - 2 Score 0 0 0 0 0        Objective:  BP 122/78   Pulse 78   Ht 6' 2 (1.88 m)   Wt 184 lb (83.5 kg)   SpO2 98%   BMI 23.62 kg/m   General appearance: alert, no distress, WD/WN, African American male Skin: unremarkable HEENT: normocephalic, conjunctiva/corneas normal, sclerae anicteric, PERRLA, EOMi Neck:  supple, no lymphadenopathy, no thyromegaly, no masses, normal ROM, no bruits Chest: non tender, normal shape and expansion Heart: RRR, normal S1, S2, no murmurs Lungs: CTA bilaterally, no wheezes, rhonchi, or rales Abdomen: +bs, soft, non tender, non distended, no masses, no hepatomegaly, no splenomegaly, no bruits Back: non tender, normal ROM, no scoliosis Musculoskeletal: upper extremities non tender, no obvious deformity, normal ROM throughout, lower extremities non tender, no obvious deformity, normal ROM throughout Extremities: no edema, no cyanosis, no clubbing Pulses: 2+ symmetric, upper and lower extremities, normal cap refill Neurological: alert, oriented x 3, CN2-12 intact, strength normal upper extremities and lower extremities, sensation normal throughout, DTRs 2+ throughout, no cerebellar signs, gait normal Psychiatric: normal affect, behavior normal, pleasant  GU/rectal -deferred/declined    Assessment and Plan :   Encounter Diagnoses  Name Primary?   Encounter for health maintenance examination in adult Yes   Essential hypertension, benign    Hyperlipidemia, unspecified hyperlipidemia type    Low testosterone     Screening for prostate cancer    Stage 3 chronic kidney disease, unspecified whether stage 3a or 3b CKD (HCC)    Benign prostatic hyperplasia without  lower urinary tract symptoms    Erectile dysfunction, unspecified erectile dysfunction type    Hypogonadism male    Anemia of renal disease    Vaccine refused by patient    Impaired fasting glucose     This visit was a preventative care visit, also known as wellness visit or routine physical.   Topics typically include healthy lifestyle, diet, exercise, preventative care, vaccinations, sick and well care, proper use of emergency dept and after hours care, as well as other concerns.     Recommendations: Continue to return yearly for your annual wellness and preventative care visits.  This gives us  a chance to discuss healthy lifestyle, exercise, vaccinations, review your chart record, and perform screenings where appropriate.  I recommend you see your eye doctor yearly for routine vision care.  I recommend you see your dentist yearly for routine dental care including hygiene visits twice yearly.   Vaccination recommendations were reviewed Immunization History  Administered Date(s) Administered   Influenza,inj,Quad PF,6+ Mos 06/04/2020, 04/01/2021, 06/30/2022   PFIZER(Purple Top)SARS-COV-2 Vaccination 10/09/2019, 10/30/2019, 06/04/2020   Pneumococcal Polysaccharide-23 04/07/2022   Tdap 12/14/2017   Zoster Recombinant(Shingrix ) 05/11/2023, 07/13/2023    Declines vaccines today    Screening for cancer: Colon cancer screening: Reviewed 04/2023 colonoscopy, repeat in 10 years  We discussed PSA, prostate exam, and prostate cancer screening risks/benefits.     Skin cancer screening: Check your skin regularly for new changes, growing lesions, or other lesions of concern Come in for evaluation if you have skin lesions of concern.  Lung cancer screening: If you have a greater than 20 pack year history of tobacco use, then you may qualify for lung cancer screening with a chest CT scan.   Please call your insurance company to inquire about coverage for this test.  We currently don't  have screenings for other cancers besides breast, cervical, colon, and lung cancers.  If you have a strong family history of cancer or have other cancer screening concerns, please let me know.    Bone health: Get at least 150 minutes of aerobic exercise weekly Get weight bearing exercise at least once weekly Bone density test:  A bone density test is an imaging test that uses a type of X-ray to measure the amount of calcium and other minerals in your bones. The test may be used  to diagnose or screen you for a condition that causes weak or thin bones (osteoporosis), predict your risk for a broken bone (fracture), or determine how well your osteoporosis treatment is working. The bone density test is recommended for females 65 and older, or females or males <65 if certain risk factors such as thyroid disease, long term use of steroids such as for asthma or rheumatological issues, vitamin D  deficiency, estrogen deficiency, family history of osteoporosis, self or family history of fragility fracture in first degree relative.    Heart health: Get at least 150 minutes of aerobic exercise weekly Limit alcohol It is important to maintain a healthy blood pressure and healthy cholesterol numbers  Heart disease screening: Screening for heart disease includes screening for blood pressure, fasting lipids, glucose/diabetes screening, BMI height to weight ratio, reviewed of smoking status, physical activity, and diet.    Goals include blood pressure 120/80 or less, maintaining a healthy lipid/cholesterol profile, preventing diabetes or keeping diabetes numbers under good control, not smoking or using tobacco products, exercising most days per week or at least 150 minutes per week of exercise, and eating healthy variety of fruits and vegetables, healthy oils, and avoiding unhealthy food choices like fried food, fast food, high sugar and high cholesterol foods.    Other tests may possibly include EKG test, CT  coronary calcium score, echocardiogram, exercise treadmill stress test.    Reviewed 09/2023 cardiology notes, they continued his current regimen   Medical care options: I recommend you continue to seek care here first for routine care.  We try really hard to have available appointments Monday through Friday daytime hours for sick visits, acute visits, and physicals.  Urgent care should be used for after hours and weekends for significant issues that cannot wait till the next day.  The emergency department should be used for significant potentially life-threatening emergencies.  The emergency department is expensive, can often have long wait times for less significant concerns, so try to utilize primary care, urgent care, or telemedicine when possible to avoid unnecessary trips to the emergency department.  Virtual visits and telemedicine have been introduced since the pandemic started in 2020, and can be convenient ways to receive medical care.  We offer virtual appointments as well to assist you in a variety of options to seek medical care.    Separate significant issues discussed: Hypertension-compliant with medications, amlodipine  10 mg daily, irbesartan  150 mg daily  Hyperlipidemia-continue pravastatin  20 mg daily  Low testosterone -compliant with Jatenzo  started in 01/2024 changing from IM testosterone , routine labs including PSA screening    Glaucoma-follow-up with eye doctor as scheduled  CKD 3-sees nephrology, reviewed 11/2023 notes and labs  Erectile dysfunction - continue viagra  prn  Return for fasting labs as he is nonfasting today  Impaired glucose - updated labs   Pierce was seen today for other.  Diagnoses and all orders for this visit:  Encounter for health maintenance examination in adult -     CBC; Future -     Comprehensive metabolic panel with GFR; Future -     Lipid panel; Future -     PSA; Future -     Hemoglobin A1c; Future -     Urinalysis, Routine w reflex  microscopic; Future -     Cancel: Microalbumin/Creatinine Ratio, Urine; Future -     Testosterone ; Future  Essential hypertension, benign  Hyperlipidemia, unspecified hyperlipidemia type -     Lipid panel; Future  Low testosterone  -     Testosterone ;  Future  Screening for prostate cancer -     PSA; Future  Stage 3 chronic kidney disease, unspecified whether stage 3a or 3b CKD (HCC) -     Comprehensive metabolic panel with GFR; Future  Benign prostatic hyperplasia without lower urinary tract symptoms -     PSA; Future  Erectile dysfunction, unspecified erectile dysfunction type  Hypogonadism male -     Testosterone ; Future  Anemia of renal disease  Vaccine refused by patient  Impaired fasting glucose -     Hemoglobin A1c; Future   Follow-up pending labs, yearly for physical

## 2024-06-08 ENCOUNTER — Other Ambulatory Visit: Payer: Self-pay | Admitting: Medical

## 2024-06-13 ENCOUNTER — Other Ambulatory Visit: Payer: PRIVATE HEALTH INSURANCE

## 2024-06-13 DIAGNOSIS — R7989 Other specified abnormal findings of blood chemistry: Secondary | ICD-10-CM

## 2024-06-13 DIAGNOSIS — Z125 Encounter for screening for malignant neoplasm of prostate: Secondary | ICD-10-CM

## 2024-06-13 DIAGNOSIS — N183 Chronic kidney disease, stage 3 unspecified: Secondary | ICD-10-CM

## 2024-06-13 DIAGNOSIS — E785 Hyperlipidemia, unspecified: Secondary | ICD-10-CM

## 2024-06-13 DIAGNOSIS — N400 Enlarged prostate without lower urinary tract symptoms: Secondary | ICD-10-CM

## 2024-06-13 DIAGNOSIS — Z0000 Encounter for general adult medical examination without abnormal findings: Secondary | ICD-10-CM

## 2024-06-13 DIAGNOSIS — R7301 Impaired fasting glucose: Secondary | ICD-10-CM

## 2024-06-13 DIAGNOSIS — E291 Testicular hypofunction: Secondary | ICD-10-CM

## 2024-06-13 LAB — LIPID PANEL

## 2024-06-14 ENCOUNTER — Other Ambulatory Visit: Payer: Self-pay | Admitting: Medical

## 2024-06-14 ENCOUNTER — Ambulatory Visit: Payer: Self-pay | Admitting: Medical

## 2024-06-14 LAB — CBC
Hematocrit: 46.5 % (ref 37.5–51.0)
Hemoglobin: 15.1 g/dL (ref 13.0–17.7)
MCH: 29.7 pg (ref 26.6–33.0)
MCHC: 32.5 g/dL (ref 31.5–35.7)
MCV: 92 fL (ref 79–97)
Platelets: 233 x10E3/uL (ref 150–450)
RBC: 5.08 x10E6/uL (ref 4.14–5.80)
RDW: 12.9 % (ref 11.6–15.4)
WBC: 7.4 x10E3/uL (ref 3.4–10.8)

## 2024-06-14 LAB — URINALYSIS, ROUTINE W REFLEX MICROSCOPIC
Bilirubin, UA: NEGATIVE
Glucose, UA: NEGATIVE
Ketones, UA: NEGATIVE
Leukocytes,UA: NEGATIVE
Nitrite, UA: NEGATIVE
RBC, UA: NEGATIVE
Specific Gravity, UA: 1.019 (ref 1.005–1.030)
Urobilinogen, Ur: 0.2 mg/dL (ref 0.2–1.0)
pH, UA: 5.5 (ref 5.0–7.5)

## 2024-06-14 LAB — LIPID PANEL
Cholesterol, Total: 148 mg/dL (ref 100–199)
HDL: 37 mg/dL — AB (ref >39)
LDL CALC COMMENT:: 4 ratio (ref 0.0–5.0)
LDL Chol Calc (NIH): 100 mg/dL — AB (ref 0–99)
Triglycerides: 52 mg/dL (ref 0–149)
VLDL Cholesterol Cal: 11 mg/dL (ref 5–40)

## 2024-06-14 LAB — PSA: Prostate Specific Ag, Serum: 2.6 ng/mL (ref 0.0–4.0)

## 2024-06-14 LAB — COMPREHENSIVE METABOLIC PANEL WITH GFR
ALT: 14 IU/L (ref 0–44)
AST: 18 IU/L (ref 0–40)
Albumin: 4.8 g/dL (ref 3.8–4.9)
Alkaline Phosphatase: 54 IU/L (ref 47–123)
BUN/Creatinine Ratio: 10 (ref 10–24)
BUN: 19 mg/dL (ref 8–27)
Bilirubin Total: 0.3 mg/dL (ref 0.0–1.2)
CO2: 22 mmol/L (ref 20–29)
Calcium: 10.1 mg/dL (ref 8.6–10.2)
Chloride: 108 mmol/L — ABNORMAL HIGH (ref 96–106)
Creatinine, Ser: 1.91 mg/dL — ABNORMAL HIGH (ref 0.76–1.27)
Globulin, Total: 2.7 g/dL (ref 1.5–4.5)
Glucose: 94 mg/dL (ref 70–99)
Potassium: 4.5 mmol/L (ref 3.5–5.2)
Sodium: 144 mmol/L (ref 134–144)
Total Protein: 7.5 g/dL (ref 6.0–8.5)
eGFR: 40 mL/min/1.73 — ABNORMAL LOW (ref >59)

## 2024-06-14 LAB — HEMOGLOBIN A1C
Est. average glucose Bld gHb Est-mCnc: 131 mg/dL
Hgb A1c MFr Bld: 6.2 % — ABNORMAL HIGH (ref 4.8–5.6)

## 2024-06-14 LAB — TESTOSTERONE: Testosterone: 787 ng/dL (ref 264–916)

## 2024-06-14 MED ORDER — JATENZO 237 MG PO CAPS: 1.0000 | ORAL_CAPSULE | Freq: Two times a day (BID) | ORAL | 1 refills | Status: DC

## 2024-06-14 MED ORDER — AMLODIPINE BESYLATE 10 MG PO TABS: 10.0000 mg | ORAL_TABLET | Freq: Every day | ORAL | 3 refills | Status: DC

## 2024-06-14 MED ORDER — IRBESARTAN 150 MG PO TABS: 150.0000 mg | ORAL_TABLET | Freq: Every day | ORAL | 3 refills | Status: DC

## 2024-06-14 NOTE — Progress Notes (Signed)
 Results through [facility] a copy of notes and labs to [facility]

## 2024-06-20 ENCOUNTER — Other Ambulatory Visit: Payer: Self-pay | Admitting: Medical

## 2024-06-20 ENCOUNTER — Telehealth: Payer: Self-pay | Admitting: Medical

## 2024-06-20 MED ORDER — IRBESARTAN 150 MG PO TABS: 150.0000 mg | ORAL_TABLET | Freq: Every day | ORAL | 0 refills | Status: DC

## 2024-06-20 MED ORDER — IRBESARTAN 150 MG PO TABS: 150.0000 mg | ORAL_TABLET | Freq: Every day | ORAL | 1 refills | Status: DC

## 2024-06-20 NOTE — Telephone Encounter (Signed)
 Patient came in asking if his Irbesartan  can be sent to  [id number] - [place], [place] - [address]  And then a 90 day supply be sent through mail order [facility] [address] [place] [address] [phone].  He asks for it this way because it is cheaper but he is out so that's why he is asking for a 30 day sent to walk in pharm first. He has not picked up what was sent in on 12/2

## 2024-06-20 NOTE — Telephone Encounter (Signed)
Sent #30 to local pharmacy

## 2024-07-25 ENCOUNTER — Other Ambulatory Visit: Payer: Self-pay | Admitting: Medical

## 2024-07-25 ENCOUNTER — Telehealth: Payer: Self-pay | Admitting: Medical

## 2024-07-25 MED ORDER — PRAVASTATIN SODIUM 20 MG PO TABS: 20.0000 mg | ORAL_TABLET | Freq: Every day | ORAL | 1 refills | Status: AC

## 2024-07-25 MED ORDER — VITAMIN D3 50 MCG (2000 UT) PO CAPS: 2000.0000 [IU] | ORAL_CAPSULE | Freq: Every day | ORAL | 1 refills | Status: DC

## 2024-07-25 MED ORDER — JATENZO 237 MG PO CAPS: 1.0000 | ORAL_CAPSULE | Freq: Two times a day (BID) | ORAL | 1 refills | Status: DC

## 2024-07-25 MED ORDER — IRBESARTAN 150 MG PO TABS: 150.0000 mg | ORAL_TABLET | Freq: Every day | ORAL | 1 refills | Status: AC

## 2024-07-25 MED ORDER — AMLODIPINE BESYLATE 10 MG PO TABS: 10.0000 mg | ORAL_TABLET | Freq: Every day | ORAL | 1 refills | Status: AC

## 2024-07-25 NOTE — Telephone Encounter (Signed)
 Refilled everything but vitamin d3 and jatenzo . Please refill

## 2024-07-25 NOTE — Telephone Encounter (Signed)
 Fax from [facility]   Pravastatin  Amlodipine  Jatenzo  Vitamin D  3 Irbesartan 

## 2024-07-27 MED ORDER — SILDENAFIL CITRATE 100 MG PO TABS: 100.0000 mg | ORAL_TABLET | ORAL | 2 refills | Status: AC | PRN

## 2024-07-27 MED ORDER — VITAMIN D3 50 MCG (2000 UT) PO CAPS: 2000.0000 [IU] | ORAL_CAPSULE | Freq: Every day | ORAL | 1 refills | Status: AC

## 2024-07-27 MED ORDER — JATENZO 237 MG PO CAPS: 1.0000 | ORAL_CAPSULE | Freq: Two times a day (BID) | ORAL | 1 refills | Status: AC

## 2024-07-27 NOTE — Telephone Encounter (Signed)
 Ludie- please see pended medications and send them to [facility]  Copied from CRM #[id number]. Topic: Clinical - Medication Question >> Jul 27, 2024 12:54 PM Gustabo D wrote: Pt is wanting all his medication to be switched to mail order- [facility] New insurance is [employer] [employer] under [place] -[phone] Couldn't be processed when I tried adding it.

## 2024-08-08 ENCOUNTER — Other Ambulatory Visit: Payer: Self-pay | Admitting: Medical

## 2024-08-08 ENCOUNTER — Telehealth: Payer: Self-pay | Admitting: Internal Medicine

## 2024-08-08 MED ORDER — TESTOSTERONE CYPIONATE 200 MG/ML IJ SOLN: 200.0000 mg | INTRAMUSCULAR | 1 refills | Status: AC

## 2024-08-08 NOTE — Telephone Encounter (Signed)
" ° ° °  Jatenzo  is not on formulary. Needs alternative             "

## 2024-08-10 NOTE — Telephone Encounter (Signed)
 Pt was notified.

## 2024-08-15 ENCOUNTER — Other Ambulatory Visit: Payer: PRIVATE HEALTH INSURANCE

## 2024-08-22 ENCOUNTER — Other Ambulatory Visit: Payer: Self-pay

## 2025-06-12 ENCOUNTER — Encounter: Payer: PRIVATE HEALTH INSURANCE | Admitting: Medical
